# Patient Record
Sex: Female | Born: 1990 | Race: Black or African American | Hispanic: No | Marital: Single | Smoking: Former smoker
Health system: Southern US, Community
[De-identification: ages and names within clinical notes are randomized; demographics above are authoritative.]

## PROBLEM LIST (undated history)

## (undated) ENCOUNTER — Inpatient Hospital Stay (HOSPITAL_COMMUNITY): Payer: Self-pay

## (undated) DIAGNOSIS — K219 Gastro-esophageal reflux disease without esophagitis: Secondary | ICD-10-CM

## (undated) DIAGNOSIS — F419 Anxiety disorder, unspecified: Secondary | ICD-10-CM

## (undated) DIAGNOSIS — J45909 Unspecified asthma, uncomplicated: Secondary | ICD-10-CM

## (undated) DIAGNOSIS — A599 Trichomoniasis, unspecified: Secondary | ICD-10-CM

---
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
## 2012-02-09 ENCOUNTER — Inpatient Hospital Stay (HOSPITAL_COMMUNITY)
Admission: AD | Admit: 2012-02-09 | Discharge: 2012-02-09 | Disposition: A | Discharge status: Home / Self Care | Payer: Medicaid Other | Source: Ambulatory Visit | Attending: Obstetrics & Gynecology | Admitting: Obstetrics & Gynecology

## 2012-02-09 ENCOUNTER — Inpatient Hospital Stay (HOSPITAL_COMMUNITY): Payer: Medicaid Other

## 2012-02-09 ENCOUNTER — Encounter (HOSPITAL_COMMUNITY): Payer: Self-pay | Admitting: *Deleted

## 2012-02-09 DIAGNOSIS — O0930 Supervision of pregnancy with insufficient antenatal care, unspecified trimester: Secondary | ICD-10-CM

## 2012-02-09 DIAGNOSIS — O26859 Spotting complicating pregnancy, unspecified trimester: Secondary | ICD-10-CM | POA: Insufficient documentation

## 2012-02-09 DIAGNOSIS — R109 Unspecified abdominal pain: Secondary | ICD-10-CM | POA: Insufficient documentation

## 2012-02-09 DIAGNOSIS — N939 Abnormal uterine and vaginal bleeding, unspecified: Secondary | ICD-10-CM

## 2012-02-09 HISTORY — DX: Anxiety disorder, unspecified: F41.9

## 2012-02-09 HISTORY — DX: Unspecified asthma, uncomplicated: J45.909

## 2012-02-09 LAB — URINALYSIS, ROUTINE W REFLEX MICROSCOPIC
Bilirubin Urine: NEGATIVE
Ketones, ur: NEGATIVE mg/dL
Nitrite: NEGATIVE
Protein, ur: NEGATIVE mg/dL

## 2012-02-09 LAB — WET PREP, GENITAL

## 2012-02-09 LAB — URINE MICROSCOPIC-ADD ON

## 2012-02-09 MED ORDER — CONCEPT OB 130-92.4-1 MG PO CAPS: 1.0000 | ORAL_CAPSULE | ORAL | Status: DC

## 2012-02-09 NOTE — MAU Provider Note (Signed)
Chief Complaint:  Abdominal Pain and Vaginal Bleeding   First Provider Initiated Contact with Patient 02/09/12 1551      HPI: Marissa Lucas is a 21 y.o. G2P1001 at [redacted]w[redacted]d who presents to maternity admissions reporting intermittent lower abdominal crampy pain since yesterday and saw some brown discharge with wiping this morning. No frank blood and no further brown discharge. Denies irritation or itch. Denies dysuria, frequency or urgency of urination, hematuria, no hemorrhoids. Denies tobacco or illicit drug use. Denies contractions or leakage of fluid. Good fetal movement.   Pregnancy Course: Has had some prenatal care in [place] and done [place] and is planning to get regular prenatal care at [facility]. Last prenatal checkup about 6 weeks ago. No known pregnancy problems. Living at [facility] at [facility] now. States 17 week outside ultrasound was normal.  Past Medical History: Past Medical History  Diagnosis Date  . Asthma   . Anxiety     Past obstetric history: OB History    Grav Para Term Preterm Abortions TAB SAB Ect Mult Living   2 1 1       1      # Outc Date GA Lbr Len/2nd Wgt Sex Del Anes PTL Lv   1 TRM            2 CUR             uncomplicated SVD 2009  Past Surgical History: Past Surgical History  Procedure Date  . No past surgeries     Family History: Family History  Problem Relation Age of Onset  . Other Neg Hx     Social History: History  Substance Use Topics  . Smoking status: Never Smoker   . Smokeless tobacco: Never Used  . Alcohol Use: No    Allergies: No Known Allergies  Meds:  Prescriptions prior to admission  Medication Sig Dispense Refill  . ondansetron (ZOFRAN) 4 MG tablet Take 4 mg by mouth every 8 (eight) hours as needed. nausea        ROS: Pertinent findings in history of present illness.  Physical Exam  Blood pressure 113/66, pulse 96, temperature 98.3 F (36.8 C), temperature source Oral, resp. rate 18, height 5\' 7"  (1.702 m), weight 170  lb (77.111 kg). GENERAL: Well-developed, well-nourished female in no acute distress.  HEENT: normocephalic HEART: normal rate RESP: normal effort ABDOMEN: Soft, non-tender, gravid appropriate for gestational age EXTREMITIES: Nontender, no edema NEURO: alert and oriented SPECULUM EXAM: NEFG, physiologic discharge, no blood, cervix beefy appearance but no blood on endocx. swab  Dilation: Closed Effacement (%): Thick Exam by:: D. Ahaan Zobrist cx posterior, soft, closed/,long/breech out of pelvis  FHT:  Baseline 125-130 , moderate variability, accelerations present, no decelerations Contractions: irregular, mild   Labs: Results for orders placed during the hospital encounter of 02/09/12 (from the past 24 hour(s))  URINALYSIS, ROUTINE W REFLEX MICROSCOPIC     Status: Abnormal   Collection Time   02/09/12  1:00 PM      Component Value Range   Color, Urine YELLOW  YELLOW   APPearance CLOUDY (*) CLEAR   Specific Gravity, Urine 1.020  1.005 - 1.030   pH 7.5  5.0 - 8.0   Glucose, UA NEGATIVE  NEGATIVE mg/dL   Hgb urine dipstick NEGATIVE  NEGATIVE   Bilirubin Urine NEGATIVE  NEGATIVE   Ketones, ur NEGATIVE  NEGATIVE mg/dL   Protein, ur NEGATIVE  NEGATIVE mg/dL   Urobilinogen, UA 0.2  0.0 - 1.0 mg/dL   Nitrite  NEGATIVE  NEGATIVE   Leukocytes, UA SMALL (*) NEGATIVE  URINE MICROSCOPIC-ADD ON     Status: Abnormal   Collection Time   02/09/12  1:00 PM      Component Value Range   Squamous Epithelial / LPF MANY (*) RARE   WBC, UA 0-2  <3 WBC/hpf   Bacteria, UA MANY (*) RARE    Imaging:   D/W Dr. Marice Potter: Will get limited US this visit since [facility] here, no records *RADIOLOGY REPORT*  Clinical Data: History of spotting.  LIMITED OBSTETRIC ULTRASOUND  Number of Fetuses: 1  Heart Rate: 138 bpm  Presentation: Cephalic  Placental Location: Anterior above the cervical loss.  Previa: Not identified  Amniotic Fluid (Subjective): Within normal limits.  Vertical pocket: 4.55cm AFI: 53rd percentile   BPD: 7.5cm 30w 2d EDC: 04/22/2012  MATERNAL FINDINGS:  Cervix: Closed and 3 cm in length.  Uterus/Adnexae: Unremarkable.  IMPRESSION:  1. Single viable IUP estimated gestational age of [redacted] weeks and 2  days, normal fetal heart rate of 138 beats per minute and normal  amnionic fluid index.    GC/CT, urine C&S sent  Assessment: 1. Vaginal spotting   2. Insufficient prenatal care    G2P1001 at [redacted]w[redacted]d with hx vaginal spotting; lapsed [facility]  Plan: Discharge home Labor precautions and fetal kick counts  Follow-up Information    Schedule an appointment as soon as possible for a visit with [facility]. (If they are not able to see you for pregnancy care call [phone] [facility])    Contact information:   [address] [place] [address] [phone]          Medication List     As of 02/09/2012  6:23 PM    TAKE these medications         CONCEPT OB 130-92.4-1 MG Caps   Take 1 tablet by mouth 1 day or 1 dose.      ondansetron 4 MG tablet   Commonly known as: ZOFRAN   Take 4 mg by mouth every 8 (eight) hours as needed. nausea          Danae Orleans, CNM 02/09/2012 3:53 PM

## 2012-02-09 NOTE — MAU Note (Signed)
No care for past 6wk, [place] different county.  Past couple days has been having lower abd cramping.  Brownish spotting today.

## 2012-02-10 LAB — URINE CULTURE: Colony Count: 30000

## 2012-02-17 ENCOUNTER — Inpatient Hospital Stay (HOSPITAL_COMMUNITY)
Admission: AD | Admit: 2012-02-17 | Discharge: 2012-02-17 | Disposition: A | Discharge status: Home / Self Care | Payer: Medicaid Other | Source: Ambulatory Visit | Attending: Obstetrics & Gynecology | Admitting: Obstetrics & Gynecology

## 2012-02-17 ENCOUNTER — Encounter (HOSPITAL_COMMUNITY): Payer: Self-pay | Admitting: *Deleted

## 2012-02-17 DIAGNOSIS — F5089 Other specified eating disorder: Secondary | ICD-10-CM

## 2012-02-17 DIAGNOSIS — O9989 Other specified diseases and conditions complicating pregnancy, childbirth and the puerperium: Secondary | ICD-10-CM | POA: Insufficient documentation

## 2012-02-17 DIAGNOSIS — L299 Pruritus, unspecified: Secondary | ICD-10-CM | POA: Insufficient documentation

## 2012-02-17 DIAGNOSIS — O0930 Supervision of pregnancy with insufficient antenatal care, unspecified trimester: Secondary | ICD-10-CM | POA: Insufficient documentation

## 2012-02-17 DIAGNOSIS — Z3480 Encounter for supervision of other normal pregnancy, unspecified trimester: Secondary | ICD-10-CM

## 2012-02-17 MED ORDER — DIPHENHYDRAMINE HCL 25 MG PO TABS: 25.0000 mg | ORAL_TABLET | Freq: Four times a day (QID) | ORAL | Status: DC | PRN

## 2012-02-17 MED ORDER — DIPHENHYDRAMINE HCL 25 MG PO CAPS
25.0000 mg | ORAL_CAPSULE | Freq: Once | ORAL | Status: AC
  Administered 2012-02-17: 25 mg via ORAL
  Filled 2012-02-17: qty 1

## 2012-02-17 NOTE — MAU Note (Signed)
Itching X 1-2 weeks. States had a "few rashes" but they are gone now. Still itching, worse at night. Has not tried any medication or cream.

## 2012-02-17 NOTE — MAU Provider Note (Signed)
Chief Complaint:  Pruritis   First Provider Initiated Contact with Patient 02/17/12 1021      HPI: Marissa Lucas is a 21 y.o. G2P1001 at [redacted]w[redacted]d who presents to maternity admissions reporting 1-2 wk hx of generalized itching over shoulders, chest, arms and thighs. Has not tried any tx. Had a rash last wk described as small bumps on chest and arms but it has resolved. Denies new contacts with potential allergens. ANother resident at [facility] has had a rash of unknown etiology.  Denies contractions, leakage of fluid or vaginal bleeding. Good fetal movement.   Pregnancy Course: NPC but lives at [facility] and has NOB for 03/04/12. Has signed ROI at last MAU visit  for outside records for [place] and some PNC: no records in system.  Past Medical History: Past Medical History  Diagnosis Date  . Asthma   . Anxiety     Past obstetric history: OB History    Grav Para Term Preterm Abortions TAB SAB Ect Mult Living   2 1 1       1      # Outc Date GA Lbr Len/2nd Wgt Sex Del Anes PTL Lv   1 TRM            2 CUR               Past Surgical History: Past Surgical History  Procedure Date  . No past surgeries     Family History: Family History  Problem Relation Age of Onset  . Other Neg Hx     Social History: History  Substance Use Topics  . Smoking status: Never Smoker   . Smokeless tobacco: Never Used  . Alcohol Use: No    Allergies: No Known Allergies  Meds:  Prescriptions prior to admission  Medication Sig Dispense Refill  . ALBUTEROL IN Inhale 2 puffs into the lungs daily as needed. For shortness of breath.      . ondansetron (ZOFRAN) 4 MG tablet Take 4 mg by mouth every 8 (eight) hours as needed. nausea        ROS: Pertinent findings in history of present illness.  Physical Exam  Blood pressure 113/56, pulse 100, temperature 98.5 F (36.9 C), temperature source Oral, resp. rate 18, height 5\' 7"  (1.702 m), weight 172 lb 3.2 oz (78.109 kg). GENERAL:  Well-developed, well-nourished female in no acute distress.  HEENT: normocephalic HEART: normal rate RESP: normal effort ABDOMEN: Soft, non-tender, gravid appropriate for gestational age EXTREMITIES: Nontender, no edema NEURO: alert and oriented SKIN: no rash noted, scratching observed  FHT:  Baseline 120-125 , moderate variability, accelerations present, no decelerations Contractions: none   Labs: No results found for this or any previous visit (from the past 24 hour(s)).  Imaging:  US Ob Limited  02/09/2012  *RADIOLOGY REPORT*  Clinical Data: History of spotting.  LIMITED OBSTETRIC ULTRASOUND  Number of Fetuses: 1 Heart Rate: 138 bpm Presentation: Cephalic Placental Location: Anterior above the cervical loss. Previa: Not identified Amniotic Fluid (Subjective): Within normal limits.  Vertical pocket:  4.55cm      AFI: 53rd percentile  BPD: 7.5cm   30w   2d   EDC: 04/22/2012  MATERNAL FINDINGS: Cervix: Closed and 3 cm in length. Uterus/Adnexae: Unremarkable.  IMPRESSION: 1.  Single viable IUP estimated gestational age of [redacted] weeks and 2 days, normal fetal heart rate of 138 beats per minute and normal amnionic fluid index.   Original Report Authenticated By: Trudie Reed, M.D.  MAU Course: Benadryl 25 mg po given with some relief  Assessment: 1. Generalized pruritus   2. Insufficient prenatal care     Plan: Discharge home Return fasting for bile acids and outpt [place] this week See AVS Follow-up Information    Follow up with [facility]. On 03/04/2012.          Medication List     As of 02/17/2012 10:32 AM    ASK your doctor about these medications         ALBUTEROL IN   Inhale 2 puffs into the lungs daily as needed. For shortness of breath.      ondansetron 4 MG tablet   Commonly known as: ZOFRAN   Take 4 mg by mouth every 8 (eight) hours as needed. nausea        Danae Orleans, CNM 02/17/2012 10:32 AM

## 2012-02-18 NOTE — L&D Delivery Note (Signed)
Delivery Note Pt pushed approx one hour and at 9:26 PM a viable female was delivered via Vaginal, Spontaneous Delivery (Presentation: ; Right Occiput Posterior).  APGAR: 8, 6 ; weight 7+6.  Infant to pt's abd, dried. Cord clamped and cut by CNM. Hospital cord sample collected. Placenta status: Intact, Spontaneous.  Cord: 3 vessels   Anesthesia: Epidural  Episiotomy: None Lacerations: None Est. Blood Loss (mL): 350  Mom to postpartum.  Baby to nursery-stable.  Marissa Lucas 04/16/2012, 10:01 PM

## 2012-02-18 NOTE — L&D Delivery Note (Signed)
Attestation of Attending Supervision of Advanced Practitioner (CNM/NP): Evaluation and management procedures were performed by the Advanced Practitioner under my supervision and collaboration.  I have reviewed the Advanced Practitioner's note and chart, and I agree with the management and plan.  Jaylei Fuerte 04/21/2012 9:23 AM   

## 2012-02-19 ENCOUNTER — Ambulatory Visit (HOSPITAL_COMMUNITY)
Admission: RE | Admit: 2012-02-19 | Discharge: 2012-02-19 | Disposition: A | Discharge status: Home / Self Care | Payer: Medicaid Other | Source: Ambulatory Visit | Attending: Obstetrics and Gynecology | Admitting: Obstetrics and Gynecology

## 2012-02-19 DIAGNOSIS — O0930 Supervision of pregnancy with insufficient antenatal care, unspecified trimester: Secondary | ICD-10-CM | POA: Insufficient documentation

## 2012-02-19 DIAGNOSIS — Z3480 Encounter for supervision of other normal pregnancy, unspecified trimester: Secondary | ICD-10-CM

## 2012-02-19 DIAGNOSIS — Z3689 Encounter for other specified antenatal screening: Secondary | ICD-10-CM | POA: Insufficient documentation

## 2012-02-23 ENCOUNTER — Encounter (HOSPITAL_COMMUNITY): Payer: Self-pay | Admitting: *Deleted

## 2012-02-23 ENCOUNTER — Inpatient Hospital Stay (HOSPITAL_COMMUNITY)
Admission: AD | Admit: 2012-02-23 | Discharge: 2012-02-24 | Disposition: A | Discharge status: Hospice | Payer: Medicaid Other | Source: Ambulatory Visit | Attending: Obstetrics & Gynecology | Admitting: Obstetrics & Gynecology

## 2012-02-23 DIAGNOSIS — B373 Candidiasis of vulva and vagina: Secondary | ICD-10-CM

## 2012-02-23 DIAGNOSIS — O26859 Spotting complicating pregnancy, unspecified trimester: Secondary | ICD-10-CM | POA: Insufficient documentation

## 2012-02-23 DIAGNOSIS — R109 Unspecified abdominal pain: Secondary | ICD-10-CM | POA: Insufficient documentation

## 2012-02-23 DIAGNOSIS — N760 Acute vaginitis: Secondary | ICD-10-CM

## 2012-02-23 DIAGNOSIS — O4700 False labor before 37 completed weeks of gestation, unspecified trimester: Secondary | ICD-10-CM | POA: Insufficient documentation

## 2012-02-23 HISTORY — DX: Gastro-esophageal reflux disease without esophagitis: K21.9

## 2012-02-23 LAB — URINE MICROSCOPIC-ADD ON

## 2012-02-23 LAB — URINALYSIS, ROUTINE W REFLEX MICROSCOPIC
Glucose, UA: NEGATIVE mg/dL
Ketones, ur: 15 mg/dL — AB
Protein, ur: NEGATIVE mg/dL
pH: 6 (ref 5.0–8.0)

## 2012-02-23 LAB — WET PREP, GENITAL: Yeast Wet Prep HPF POC: NONE SEEN

## 2012-02-23 MED ORDER — NIFEDIPINE 10 MG PO CAPS
20.0000 mg | ORAL_CAPSULE | Freq: Once | ORAL | Status: AC
  Administered 2012-02-23: 20 mg via ORAL
  Filled 2012-02-23: qty 2

## 2012-02-23 MED ORDER — FLUCONAZOLE 150 MG PO TABS
150.0000 mg | ORAL_TABLET | Freq: Once | ORAL | Status: AC
  Administered 2012-02-23: 150 mg via ORAL
  Filled 2012-02-23: qty 1

## 2012-02-23 MED ORDER — METRONIDAZOLE 500 MG PO TABS
500.0000 mg | ORAL_TABLET | Freq: Once | ORAL | Status: AC
  Administered 2012-02-23: 500 mg via ORAL
  Filled 2012-02-23: qty 1

## 2012-02-23 NOTE — MAU Provider Note (Signed)
History     CSN: [id number]  Arrival date and time: 02/23/12 2023   None     No chief complaint on file.  HPI 22 y.o. G2P1001 at [redacted]w[redacted]d with spotting for 1 week. Sometimes small clots -reddish. Other times brownish. Back pain and cramps in lower abdomen since this morning, constant. 8/10. Taking tylenol, helps for a while. White clumps tonight. Some dysuria. No fever/chills, no nausea/vomiting. Baby moving less today but doing well up till now. No loss of fluid. Last intercourse 5 months ago. No GC/Chlamydia this pregnancy.  No prenatal care since 23 weeks, seen in [place]. Moved here but has not been able to establish care. Had sono here Friday, has appt 1/16 here at [facility]. No complications with this pregnancy and none with previous. Vaginal delivery, no complications.  OB History    Grav Para Term Preterm Abortions TAB SAB Ect Mult Living   2 1 1       1       Past Medical History  Diagnosis Date  . Asthma   . Anxiety   . GERD (gastroesophageal reflux disease)     Past Surgical History  Procedure Date  . No past surgeries     Family History  Problem Relation Age of Onset  . Other Neg Hx   . Diabetes Mother   . Heart disease Mother   . Hypertension Mother   . Kidney disease Mother   . Arthritis Maternal Grandmother   . Asthma Maternal Grandmother   . Diabetes Maternal Grandmother   . Heart disease Maternal Grandmother   . Hypertension Maternal Grandmother   . Stroke Maternal Grandmother     History  Substance Use Topics  . Smoking status: Smoked prior to pregnancy   . Smokeless tobacco: Never Used  . Alcohol Use: No    Allergies: No Known Allergies  Prescriptions prior to admission  Medication Sig Dispense Refill  . acetaminophen (TYLENOL) 325 MG tablet Take 325 mg by mouth every 6 (six) hours as needed.      . ALBUTEROL IN Inhale 2 puffs into the lungs daily as needed. For shortness of breath.      . diphenhydrAMINE (BENADRYL) 25 MG tablet Take 1 tablet  (25 mg total) by mouth every 6 (six) hours as needed for itching (May take 50 mg at night if needed for severe itching).  30 tablet  0  . ondansetron (ZOFRAN) 4 MG tablet Take 4 mg by mouth every 8 (eight) hours as needed. nausea        Review of Systems  Constitutional: Negative for fever and chills.  Eyes: Negative for blurred vision and double vision.  Respiratory: Negative for shortness of breath.   Cardiovascular: Negative for chest pain.  Gastrointestinal: Positive for abdominal pain. Negative for nausea, vomiting, diarrhea and constipation.  Genitourinary: Positive for dysuria and urgency.  Musculoskeletal: Positive for back pain.  Neurological: Negative for headaches.   Physical Exam   Blood pressure 111/75, pulse 89, temperature 98.9 F (37.2 C), temperature source Oral, resp. rate 16, height 5\' 7"  (1.702 m), weight 78.472 kg (173 lb), SpO2 100.00%.  Physical Exam  Constitutional: She is oriented to person, place, and time. She appears well-developed and well-nourished. No distress.  HENT:  Head: Normocephalic and atraumatic.  Eyes: Conjunctivae normal and EOM are normal.  Neck: Normal range of motion. Neck supple.  Cardiovascular: Normal rate, regular rhythm and normal heart sounds.   Respiratory: Effort normal and breath sounds normal. No respiratory distress.  GI: Soft. There is no tenderness. There is no rebound and no guarding.  Genitourinary:       External genitalia normal. Vagina - watery and thick/chunky discharge. Small amount of blood in vaginal vault. Cervix/vagina looks irritated. Cervix visually closed. Closed/thick/high with digital exam. Pt very uncomfortable with check and spec exam but no CMT.  Musculoskeletal: Normal range of motion. She exhibits no edema and no tenderness.  Neurological: She is alert and oriented to person, place, and time.  Skin: Skin is warm and dry.   Cervix:  Closed/thick/high  FHTs:  130, mod var, accels present, no decels TOCO:   Ctx q 4 minutes  MAU Course  Procedures  20 mg Procardia given, contractions resolved per patient. However, contractions started again at 01:00 and pt states they are stronger.  On monitor, ctx are 1 in 10 minutes or less. Cervical exam:  At 01:30, closed internally, long, high, very posterior. 10 mg nubain given IM.   Assessment and Plan  22 y.o. G2P1001 at [redacted]w[redacted]d with  - Vaginal spotting - likely due to vaginitis:  Diflucan, metronidazole - Cramping - contractions on monitor, no cervical change.  - Home with labor precautions, f/u in [place] 1/16.  Napoleon Form 02/23/2012, 10:19 PM

## 2012-02-24 ENCOUNTER — Other Ambulatory Visit (HOSPITAL_COMMUNITY): Payer: Medicaid Other

## 2012-02-24 DIAGNOSIS — B373 Candidiasis of vulva and vagina: Secondary | ICD-10-CM

## 2012-02-24 DIAGNOSIS — O4700 False labor before 37 completed weeks of gestation, unspecified trimester: Secondary | ICD-10-CM

## 2012-02-24 MED ORDER — NALBUPHINE SYRINGE 5 MG/0.5 ML
10.0000 mg | INJECTION | Freq: Once | INTRAMUSCULAR | Status: AC
  Administered 2012-02-24: 10 mg via INTRAMUSCULAR
  Filled 2012-02-24: qty 1

## 2012-02-24 MED ORDER — NIFEDIPINE 10 MG PO CAPS: 10.0000 mg | ORAL_CAPSULE | Freq: Once | ORAL | Status: DC

## 2012-02-24 MED ORDER — ONDANSETRON 4 MG PO TBDP
4.0000 mg | ORAL_TABLET | Freq: Four times a day (QID) | ORAL | Status: DC | PRN
  Administered 2012-02-24: 4 mg via ORAL
  Filled 2012-02-24: qty 1

## 2012-02-24 MED ORDER — FLUCONAZOLE 150 MG PO TABS: 150.0000 mg | ORAL_TABLET | Freq: Once | ORAL | Status: DC

## 2012-02-24 MED ORDER — METRONIDAZOLE 500 MG PO TABS: 500.0000 mg | ORAL_TABLET | Freq: Two times a day (BID) | ORAL | Status: AC

## 2012-02-25 ENCOUNTER — Encounter (HOSPITAL_COMMUNITY): Payer: Self-pay | Admitting: *Deleted

## 2012-02-25 ENCOUNTER — Inpatient Hospital Stay (HOSPITAL_COMMUNITY)
Admission: AD | Admit: 2012-02-25 | Discharge: 2012-02-25 | Disposition: A | Discharge status: Home / Self Care | Payer: Medicaid Other | Source: Ambulatory Visit | Attending: Obstetrics and Gynecology | Admitting: Obstetrics and Gynecology

## 2012-02-25 ENCOUNTER — Encounter: Payer: Self-pay | Admitting: *Deleted

## 2012-02-25 DIAGNOSIS — N939 Abnormal uterine and vaginal bleeding, unspecified: Secondary | ICD-10-CM

## 2012-02-25 DIAGNOSIS — O26859 Spotting complicating pregnancy, unspecified trimester: Secondary | ICD-10-CM | POA: Insufficient documentation

## 2012-02-25 DIAGNOSIS — Z3480 Encounter for supervision of other normal pregnancy, unspecified trimester: Secondary | ICD-10-CM

## 2012-02-25 LAB — URINALYSIS, ROUTINE W REFLEX MICROSCOPIC
Glucose, UA: NEGATIVE mg/dL
Specific Gravity, Urine: 1.025 (ref 1.005–1.030)
pH: 6 (ref 5.0–8.0)

## 2012-02-25 LAB — URINE MICROSCOPIC-ADD ON

## 2012-02-25 MED ORDER — NIFEDIPINE ER OSMOTIC RELEASE 30 MG PO TB24: 30.0000 mg | ORAL_TABLET | Freq: Every day | ORAL | Status: DC | PRN

## 2012-02-25 MED ORDER — NIFEDIPINE 10 MG PO CAPS
20.0000 mg | ORAL_CAPSULE | Freq: Once | ORAL | Status: AC
  Administered 2012-02-25: 20 mg via ORAL
  Filled 2012-02-25: qty 2

## 2012-02-25 NOTE — MAU Note (Signed)
Pt states saw blood in toilet after voiding this am, took picture to show provider, was bright red, unsure if still bleeding at present. Is cramping and having ctx's since 0300 this am. Denies abnormal vag d/c, has been seen for spotting with this pregnancy prior to today.

## 2012-02-25 NOTE — MAU Provider Note (Signed)
Chief Complaint:  Vaginal Bleeding   First Provider Initiated Contact with Patient 02/25/12 1047      HPI: .22 y.o. G2P1001 at [redacted]w[redacted]d with spotting for 1 week recently seen in MAU on 02/23/12. States she has had spotting for about one week and had one episode this AM of rush of blood (small amount tinging the toilet bowl) but otherwise nothing. Has had some abdominal pain but no changes in vision, no RUQ pain, no dysuria, hematuria, diarrhea, worsening vaginal discharge, no fever/chills, no nausea/vomiting. Baby moving > 10 x per 2 hrs. No loss of fluid. Last intercourse 5 months ago. Negative GC/Chalymidia in beginning of January 2014, and was dx with BV (currently on day 2/7 of Flagyl) on 02/23/12. Normal u/s on 02/20/12.   No prenatal care since 23 weeks, seen in [place]. Moved here but has not been able to establish care. Has appt 1/16 here at [facility]. No complications with this pregnancy and none with previous. Vaginal delivery, no complications.  Pregnancy Course:   Past Medical History: Past Medical History  Diagnosis Date  . Asthma   . Anxiety   . GERD (gastroesophageal reflux disease)     Past obstetric history: OB History    Grav Para Term Preterm Abortions TAB SAB Ect Mult Living   2 1 1       1      # Outc Date GA Lbr Len/2nd Wgt Sex Del Anes PTL Lv   1 TRM            2 CUR               Past Surgical History: Past Surgical History  Procedure Date  . No past surgeries     Family History: Family History  Problem Relation Age of Onset  . Other Neg Hx   . Diabetes Mother   . Heart disease Mother   . Hypertension Mother   . Kidney disease Mother   . Arthritis Maternal Grandmother   . Asthma Maternal Grandmother   . Diabetes Maternal Grandmother   . Heart disease Maternal Grandmother   . Hypertension Maternal Grandmother   . Stroke Maternal Grandmother     Social History: History  Substance Use Topics  . Smoking status: Never Smoker   . Smokeless tobacco: Never  Used  . Alcohol Use: No    Allergies: No Known Allergies  Meds:  Prescriptions prior to admission  Medication Sig Dispense Refill  . acetaminophen (TYLENOL) 325 MG tablet Take 325 mg by mouth every 6 (six) hours as needed.      . ALBUTEROL IN Inhale 2 puffs into the lungs daily as needed. For shortness of breath.      . diphenhydrAMINE (BENADRYL) 25 MG tablet Take 1 tablet (25 mg total) by mouth every 6 (six) hours as needed for itching (May take 50 mg at night if needed for severe itching).  30 tablet  0  . fluconazole (DIFLUCAN) 150 MG tablet Take 1 tablet (150 mg total) by mouth once. Take tablet in one week if vaginal discharge/discomfort not improved  1 tablet  0  . metroNIDAZOLE (FLAGYL) 500 MG tablet Take 1 tablet (500 mg total) by mouth 2 (two) times daily.  13 tablet  0  . ondansetron (ZOFRAN) 4 MG tablet Take 4 mg by mouth every 8 (eight) hours as needed. nausea        ROS: Pertinent findings in history of present illness.  Physical Exam  Blood pressure 112/71, pulse 91,  temperature 98.1 F (36.7 C), temperature source Oral, resp. rate 18, height 5\' 7"  (1.702 m), weight 77.792 kg (171 lb 8 oz). GENERAL: Well-developed, well-nourished female in no acute distress.  HEENT: normocephalic HEART: normal rate RESP: normal effort ABDOMEN: Soft, non-tender, gravid appropriate for gestational age EXTREMITIES: Nontender, no edema NEURO: alert and oriented SPECULUM EXAM: NEFG, physiologic discharge, no blood, cervix clean Dilation: 1 Effacement (%): Thick Station:  (high) Exam by:: Telford Nab CNM  FHT:  Baseline 130 , moderate variability, accelerations present, no decelerations Contractions: Irregular, infrequent   Labs: Results for orders placed during the hospital encounter of 02/25/12 (from the past 24 hour(s))  URINALYSIS, ROUTINE W REFLEX MICROSCOPIC     Status: Abnormal   Collection Time   02/25/12  9:13 AM      Component Value Range   Color, Urine YELLOW  YELLOW    APPearance CLEAR  CLEAR   Specific Gravity, Urine 1.025  1.005 - 1.030   pH 6.0  5.0 - 8.0   Glucose, UA NEGATIVE  NEGATIVE mg/dL   Hgb urine dipstick MODERATE (*) NEGATIVE   Bilirubin Urine NEGATIVE  NEGATIVE   Ketones, ur 15 (*) NEGATIVE mg/dL   Protein, ur NEGATIVE  NEGATIVE mg/dL   Urobilinogen, UA 0.2  0.0 - 1.0 mg/dL   Nitrite NEGATIVE  NEGATIVE   Leukocytes, UA SMALL (*) NEGATIVE  URINE MICROSCOPIC-ADD ON     Status: Abnormal   Collection Time   02/25/12  9:13 AM      Component Value Range   Squamous Epithelial / LPF MANY (*) RARE   WBC, UA 3-6  <3 WBC/hpf   RBC / HPF 0-2  <3 RBC/hpf   Bacteria, UA FEW (*) RARE   Urine-Other MUCOUS PRESENT      Imaging:  US Ob Comp + 14 Wk  02/19/2012  OBSTETRICAL ULTRASOUND: This exam was performed within a [facility]. The OB US report was generated in the [facility] system, and faxed to the ordering physician.   This report is also available in [employer] and in the [employer]. See [facility] Obstetric US report.   US Ob Limited  02/09/2012  *RADIOLOGY REPORT*  Clinical Data: History of spotting.  LIMITED OBSTETRIC ULTRASOUND  Number of Fetuses: 1 Heart Rate: 138 bpm Presentation: Cephalic Placental Location: Anterior above the cervical loss. Previa: Not identified Amniotic Fluid (Subjective): Within normal limits.  Vertical pocket:  4.55cm      AFI: 53rd percentile  BPD: 7.5cm   30w   2d   EDC: 04/22/2012  MATERNAL FINDINGS: Cervix: Closed and 3 cm in length. Uterus/Adnexae: Unremarkable.  IMPRESSION: 1.  Single viable IUP estimated gestational age of [redacted] weeks and 2 days, normal fetal heart rate of 138 beats per minute and normal amnionic fluid index.   Original Report Authenticated By: Trudie Reed, M.D.    MAU Course: 20 mg Procardia given for contractions.  FHT are reassuring Category 1.  Cervical exam reassuring with thick, long, 1 cm dilated and seems to be unchanged from previous exam two days ago.     Assessment: 22 y/o G2P1001 @ 31.6 with continued vaginal spotting.   1. Supervision of normal subsequent pregnancy   2. Vaginal spotting   Spotting likely d/t vaginitis and recent cervical exams - no evidence of active bleeding or cervical change  Plan: Discharge home Labor precautions, pelvic rest Continue Flagyl for BV Advised to keep appointment with Dr. Shawnie Pons.  Will send home on 8 day  supply of Procardia 30 mg XL and to take one per day for similar contractions.   Follow-up Information    Follow up with Reva Bores, MD. (Keep appointment on 03/04/12)    Contact information:   [address] [place] [address] [phone]           Medication List     As of 02/25/2012 11:00 AM    TAKE these medications         acetaminophen 325 MG tablet   Commonly known as: TYLENOL   Take 325 mg by mouth every 6 (six) hours as needed.      ALBUTEROL IN   Inhale 2 puffs into the lungs daily as needed. For shortness of breath.      diphenhydrAMINE 25 MG tablet   Commonly known as: BENADRYL   Take 1 tablet (25 mg total) by mouth every 6 (six) hours as needed for itching (May take 50 mg at night if needed for severe itching).      fluconazole 150 MG tablet   Commonly known as: DIFLUCAN   Take 1 tablet (150 mg total) by mouth once. Take tablet in one week if vaginal discharge/discomfort not improved      metroNIDAZOLE 500 MG tablet   Commonly known as: FLAGYL   Take 1 tablet (500 mg total) by mouth 2 (two) times daily.      NIFEdipine 30 MG 24 hr tablet   Commonly known as: PROCARDIA-XL/ADALAT-CC/NIFEDICAL-XL   Take 1 tablet (30 mg total) by mouth daily as needed. For sensation of contractions      ondansetron 4 MG tablet   Commonly known as: ZOFRAN   Take 4 mg by mouth every 8 (eight) hours as needed. nausea         Briscoe Deutscher, DO 02/25/2012 11:00 AM  I saw and examined patient along with resident and agree with above note.  Georges Mouse,  CNM 02/25/12 11:49 AM

## 2012-02-25 NOTE — MAU Provider Note (Signed)
Attestation of Attending Supervision of Advanced Practitioner (CNM/NP): Evaluation and management procedures were performed by the Advanced Practitioner under my supervision and collaboration.  I have reviewed the Advanced Practitioner's note and chart, and I agree with the management and plan.  Jeffrie Stander 02/25/2012 1:00 PM   

## 2012-02-26 LAB — URINE CULTURE: Colony Count: NO GROWTH

## 2012-03-04 ENCOUNTER — Ambulatory Visit (INDEPENDENT_AMBULATORY_CARE_PROVIDER_SITE_OTHER): Payer: Medicaid Other | Admitting: Family Medicine

## 2012-03-04 ENCOUNTER — Encounter: Payer: Medicaid Other | Admitting: Family Medicine

## 2012-03-04 ENCOUNTER — Encounter: Payer: Self-pay | Admitting: Family Medicine

## 2012-03-04 VITALS — BP 111/71 | Temp 97.6°F | Wt 172.0 lb

## 2012-03-04 DIAGNOSIS — K219 Gastro-esophageal reflux disease without esophagitis: Secondary | ICD-10-CM | POA: Insufficient documentation

## 2012-03-04 DIAGNOSIS — Z3480 Encounter for supervision of other normal pregnancy, unspecified trimester: Secondary | ICD-10-CM

## 2012-03-04 DIAGNOSIS — O99519 Diseases of the respiratory system complicating pregnancy, unspecified trimester: Secondary | ICD-10-CM

## 2012-03-04 DIAGNOSIS — Z23 Encounter for immunization: Secondary | ICD-10-CM

## 2012-03-04 DIAGNOSIS — J45909 Unspecified asthma, uncomplicated: Secondary | ICD-10-CM | POA: Insufficient documentation

## 2012-03-04 LAB — POCT URINALYSIS DIP (DEVICE)
Nitrite: NEGATIVE
Protein, ur: 30 mg/dL — AB
pH: 7 (ref 5.0–8.0)

## 2012-03-04 LAB — CBC
MCH: 29.5 pg (ref 26.0–34.0)
MCHC: 34.5 g/dL (ref 30.0–36.0)
Platelets: 167 10*3/uL (ref 150–400)
RDW: 12.7 % (ref 11.5–15.5)

## 2012-03-04 MED ORDER — TETANUS-DIPHTH-ACELL PERTUSSIS 5-2.5-18.5 LF-MCG/0.5 IM SUSP
0.5000 mL | Freq: Once | INTRAMUSCULAR | Status: AC
  Administered 2012-03-04: 0.5 mL via INTRAMUSCULAR

## 2012-03-04 NOTE — Progress Notes (Signed)
Nutrition note: 1st visit consult Pt has gained 14# @ [redacted]w[redacted]d, which is < expected. Pt reports eating 3-4x/d. Pt reports she is not taking PNV due to them making her sick. Pt reports N&V occ, heartburn, and constipation.  Pt received verbal & written education on general nutrition during pregnancy. Disc tips to decrease N&V, heartburn and constipation. Disc option of taking 2 chewable multivitamins vs PNV. Disc tips to increase wt gain. Disc wt gain goals of 25-35# or 1#/wk.  Pt agrees to take PNV or equivalent and include a protein source with all meals & snacks. Pt receives [facility] & plans to BF. F/u in 2-4 weeks Blondell Reveal, MS, RD, LDN

## 2012-03-04 NOTE — Progress Notes (Signed)
New Ob--started care in [place], [place]--nml anatomy and quad there.  Dating is good. 28 wk labs today-TdaP, declines flu SW

## 2012-03-04 NOTE — Patient Instructions (Signed)
Pregnancy - Third Trimester The third trimester of pregnancy (the last 3 months) is a period of the most rapid growth for you and your baby. The baby approaches a length of 20 inches and a weight of 6 to 10 pounds. The baby is adding on fat and getting ready for life outside your body. While inside, babies have periods of sleeping and waking, suck their thumbs, and hiccups. You can often feel small contractions of the uterus. This is false labor. It is also called Braxton-Hicks contractions. This is like a practice for labor. The usual problems in this stage of pregnancy include more difficulty breathing, swelling of the hands and feet from water retention, and having to urinate more often because of the uterus and baby pressing on your bladder.  PRENATAL EXAMS  Blood work may continue to be done during prenatal exams. These tests are done to check on your health and the probable health of your baby. Blood work is used to follow your blood levels (hemoglobin). Anemia (low hemoglobin) is common during pregnancy. Iron and vitamins are given to help prevent this. You may also continue to be checked for diabetes. Some of the past blood tests may be done again.  The size of the uterus is measured during each visit. This makes sure your baby is growing properly according to your pregnancy dates.  Your blood pressure is checked every prenatal visit. This is to make sure you are not getting toxemia.  Your urine is checked every prenatal visit for infection, diabetes and protein.  Your weight is checked at each visit. This is done to make sure gains are happening at the suggested rate and that you and your baby are growing normally.  Sometimes, an ultrasound is performed to confirm the position and the proper growth and development of the baby. This is a test done that bounces harmless sound waves off the baby so your caregiver can more accurately determine due dates.  Discuss the type of pain medication  and anesthesia you will have during your labor and delivery.  Discuss the possibility and anesthesia if a Cesarean Section might be necessary.  Inform your caregiver if there is any mental or physical violence at home. Sometimes, a specialized non-stress test, contraction stress test and biophysical profile are done to make sure the baby is not having a problem. Checking the amniotic fluid surrounding the baby is called an amniocentesis. The amniotic fluid is removed by sticking a needle into the belly (abdomen). This is sometimes done near the end of pregnancy if an early delivery is required. In this case, it is done to help make sure the baby's lungs are mature enough for the baby to live outside of the womb. If the lungs are not mature and it is unsafe to deliver the baby, an injection of cortisone medication is given to the mother 1 to 2 days before the delivery. This helps the baby's lungs mature and makes it safer to deliver the baby. CHANGES OCCURING IN THE THIRD TRIMESTER OF PREGNANCY Your body goes through many changes during pregnancy. They vary from person to person. Talk to your caregiver about changes you notice and are concerned about.  During the last trimester, you have probably had an increase in your appetite. It is normal to have cravings for certain foods. This varies from person to person and pregnancy to pregnancy.  You may begin to get stretch marks on your hips, abdomen, and breasts. These are normal changes in the body   during pregnancy. There are no exercises or medications to take which prevent this change.  Constipation may be treated with a stool softener or adding bulk to your diet. Drinking lots of fluids, fiber in vegetables, fruits, and whole grains are helpful.  Exercising is also helpful. If you have been very active up until your pregnancy, most of these activities can be continued during your pregnancy. If you have been less active, it is helpful to start an  exercise program such as walking. Consult your caregiver before starting exercise programs.  Avoid all smoking, alcohol, un-prescribed drugs, herbs and "street drugs" during your pregnancy. These chemicals affect the formation and growth of the baby. Avoid chemicals throughout the pregnancy to ensure the delivery of a healthy infant.  Backache, varicose veins and hemorrhoids may develop or get worse.  You will tire more easily in the third trimester, which is normal.  The baby's movements may be stronger and more often.  You may become short of breath easily.  Your belly button may stick out.  A yellow discharge may leak from your breasts called colostrum.  You may have a bloody mucus discharge. This usually occurs a few days to a week before labor begins. HOME CARE INSTRUCTIONS   Keep your caregiver's appointments. Follow your caregiver's instructions regarding medication use, exercise, and diet.  During pregnancy, you are providing food for you and your baby. Continue to eat regular, well-balanced meals. Choose foods such as meat, fish, milk and other low fat dairy products, vegetables, fruits, and whole-grain breads and cereals. Your caregiver will tell you of the ideal weight gain.  A physical sexual relationship may be continued throughout pregnancy if there are no other problems such as early (premature) leaking of amniotic fluid from the membranes, vaginal bleeding, or belly (abdominal) pain.  Exercise regularly if there are no restrictions. Check with your caregiver if you are unsure of the safety of your exercises. Greater weight gain will occur in the last 2 trimesters of pregnancy. Exercising helps:  Control your weight.  Get you in shape for labor and delivery.  You lose weight after you deliver.  Rest a lot with legs elevated, or as needed for leg cramps or low back pain.  Wear a good support or jogging bra for breast tenderness during pregnancy. This may help if worn  during sleep. Pads or tissues may be used in the bra if you are leaking colostrum.  Do not use hot tubs, steam rooms, or saunas.  Wear your seat belt when driving. This protects you and your baby if you are in an accident.  Avoid raw meat, cat litter boxes and soil used by cats. These carry germs that can cause birth defects in the baby.  It is easier to loose urine during pregnancy. Tightening up and strengthening the pelvic muscles will help with this problem. You can practice stopping your urination while you are going to the bathroom. These are the same muscles you need to strengthen. It is also the muscles you would use if you were trying to stop from passing gas. You can practice tightening these muscles up 10 times a set and repeating this about 3 times per day. Once you know what muscles to tighten up, do not perform these exercises during urination. It is more likely to cause an infection by backing up the urine.  Ask for help if you have financial, counseling or nutritional needs during pregnancy. Your caregiver will be able to offer counseling for these   needs as well as refer you for other special needs.  Make a list of emergency phone numbers and have them available.  Plan on getting help from family or friends when you go home from the hospital.  Make a trial run to the hospital.  Take prenatal classes with the father to understand, practice and ask questions about the labor and delivery.  Prepare the baby's room/nursery.  Do not travel out of the city unless it is absolutely necessary and with the advice of your caregiver.  Wear only low or no heal shoes to have better balance and prevent falling. MEDICATIONS AND DRUG USE IN PREGNANCY  Take prenatal vitamins as directed. The vitamin should contain 1 milligram of folic acid. Keep all vitamins out of reach of children. Only a couple vitamins or tablets containing iron may be fatal to a baby or young child when  ingested.  Avoid use of all medications, including herbs, over-the-counter medications, not prescribed or suggested by your caregiver. Only take over-the-counter or prescription medicines for pain, discomfort, or fever as directed by your caregiver. Do not use aspirin, ibuprofen (Motrin, Advil, Nuprin) or naproxen (Aleve) unless OK'd by your caregiver.  Let your caregiver also know about herbs you may be using.  Alcohol is related to a number of birth defects. This includes fetal alcohol syndrome. All alcohol, in any form, should be avoided completely. Smoking will cause low birth rate and premature babies.  Street/illegal drugs are very harmful to the baby. They are absolutely forbidden. A baby born to an addicted mother will be addicted at birth. The baby will go through the same withdrawal an adult does. SEEK MEDICAL CARE IF: You have any concerns or worries during your pregnancy. It is better to call with your questions if you feel they cannot wait, rather than worry about them. DECISIONS ABOUT CIRCUMCISION You may or may not know the sex of your baby. If you know your baby is a boy, it may be time to think about circumcision. Circumcision is the removal of the foreskin of the penis. This is the skin that covers the sensitive end of the penis. There is no proven medical need for this. Often this decision is made on what is popular at the time or based upon religious beliefs and social issues. You can discuss these issues with your caregiver or pediatrician. SEEK IMMEDIATE MEDICAL CARE IF:   An unexplained oral temperature above 102 F (38.9 C) develops, or as your caregiver suggests.  You have leaking of fluid from the vagina (birth canal). If leaking membranes are suspected, take your temperature and tell your caregiver of this when you call.  There is vaginal spotting, bleeding or passing clots. Tell your caregiver of the amount and how many pads are used.  You develop a bad smelling  vaginal discharge with a change in the color from clear to white.  You develop vomiting that lasts more than 24 hours.  You develop chills or fever.  You develop shortness of breath.  You develop burning on urination.  You loose more than 2 pounds of weight or gain more than 2 pounds of weight or as suggested by your caregiver.  You notice sudden swelling of your face, hands, and feet or legs.  You develop belly (abdominal) pain. Round ligament discomfort is a common non-cancerous (benign) cause of abdominal pain in pregnancy. Your caregiver still must evaluate you.  You develop a severe headache that does not go away.  You develop visual   problems, blurred or double vision.  If you have not felt your baby move for more than 1 hour. If you think the baby is not moving as much as usual, eat something with sugar in it and lie down on your left side for an hour. The baby should move at least 4 to 5 times per hour. Call right away if your baby moves less than that.  You fall, are in a car accident or any kind of trauma.  There is mental or physical violence at home. Document Released: 01/28/2001 Document Revised: 04/28/2011 Document Reviewed: 08/02/2008 [employer] Patient Information 2013 [employer].  Breastfeeding Deciding to breastfeed is one of the best choices you can make for you and your baby. The information that follows gives a brief overview of the benefits of breastfeeding as well as common topics surrounding breastfeeding. BENEFITS OF BREASTFEEDING For the baby  The first milk (colostrum) helps the baby's digestive system function better.   There are antibodies in the mother's milk that help the baby fight off infections.   The baby has a lower incidence of asthma, allergies, and sudden infant death syndrome (SIDS).   The nutrients in breast milk are better for the baby than infant formulas, and breast milk helps the baby's brain grow better.   Babies who  breastfeed have less gas, colic, and constipation.  For the mother  Breastfeeding helps develop a very special bond between the mother and her baby.   Breastfeeding is convenient, always available at the correct temperature, and costs nothing.   Breastfeeding burns calories in the mother and helps her lose weight that was gained during pregnancy.   Breastfeeding makes the uterus contract back down to normal size faster and slows bleeding following delivery.   Breastfeeding mothers have a lower risk of developing breast cancer.  BREASTFEEDING FREQUENCY  A healthy, full-term baby may breastfeed as often as every hour or space his or her feedings to every 3 hours.   Watch your baby for signs of hunger. Nurse your baby if he or she shows signs of hunger. How often you nurse will vary from baby to baby.   Nurse as often as the baby requests, or when you feel the need to reduce the fullness of your breasts.   Awaken the baby if it has been 3 4 hours since the last feeding.   Frequent feeding will help the mother make more milk and will help prevent problems, such as sore nipples and engorgement of the breasts.  BABY'S POSITION AT THE BREAST  Whether lying down or sitting, be sure that the baby's tummy is facing your tummy.   Support the breast with 4 fingers underneath the breast and the thumb above. Make sure your fingers are well away from the nipple and baby's mouth.   Stroke the baby's lips gently with your finger or nipple.   When the baby's mouth is open wide enough, place all of your nipple and as much of the areola as possible into your baby's mouth.   Pull the baby in close so the tip of the nose and the baby's cheeks touch the breast during the feeding.  FEEDINGS AND SUCTION  The length of each feeding varies from baby to baby and from feeding to feeding.   The baby must suck about 2 3 minutes for your milk to get to him or her. This is called a "let down."  For this reason, allow the baby to feed on each breast as   long as he or she wants. Your baby will end the feeding when he or she has received the right balance of nutrients.   To break the suction, put your finger into the corner of the baby's mouth and slide it between his or her gums before removing your breast from his or her mouth. This will help prevent sore nipples.  HOW TO TELL WHETHER YOUR BABY IS GETTING ENOUGH BREAST MILK. Wondering whether or not your baby is getting enough milk is a common concern among mothers. You can be assured that your baby is getting enough milk if:   Your baby is actively sucking and you hear swallowing.   Your baby seems relaxed and satisfied after a feeding.   Your baby nurses at least 8 12 times in a 24 hour time period. Nurse your baby until he or she unlatches or falls asleep at the first breast (at least 10 20 minutes), then offer the second side.   Your baby is wetting 5 6 disposable diapers (6 8 cloth diapers) in a 24 hour period by 5 6 days of age.   Your baby is having at least 3 4 stools every 24 hours for the first 6 weeks. The stool should be soft and yellow.   Your baby should gain 4 7 ounces per week after he or she is 4 days old.   Your breasts feel softer after nursing.  REDUCING BREAST ENGORGEMENT  In the first week after your baby is born, you may experience signs of breast engorgement. When breasts are engorged, they feel heavy, warm, full, and may be tender to the touch. You can reduce engorgement if you:   Nurse frequently, every 2 3 hours. Mothers who breastfeed early and often have fewer problems with engorgement.   Place light ice packs on your breasts for 10 20 minutes between feedings. This reduces swelling. Wrap the ice packs in a lightweight towel to protect your skin. Bags of frozen vegetables work well for this purpose.   Take a warm shower or apply warm, moist heat to your breast for 5 10 minutes just before  each feeding. This increases circulation and helps the milk flow.   Gently massage your breast before and during the feeding. Using your finger tips, massage from the chest wall towards your nipple in a circular motion.   Make sure that the baby empties at least one breast at every feeding before switching sides.   Use a breast pump to empty the breasts if your baby is sleepy or not nursing well. You may also want to pump if you are returning to work oryou feel you are getting engorged.   Avoid bottle feeds, pacifiers, or supplemental feedings of water or juice in place of breastfeeding. Breast milk is all the food your baby needs. It is not necessary for your baby to have water or formula. In fact, to help your breasts make more milk, it is best not to give your baby supplemental feedings during the early weeks.   Be sure the baby is latched on and positioned properly while breastfeeding.   Wear a supportive bra, avoiding underwire styles.   Eat a balanced diet with enough fluids.   Rest often, relax, and take your prenatal vitamins to prevent fatigue, stress, and anemia.  If you follow these suggestions, your engorgement should improve in 24 48 hours. If you are still experiencing difficulty, call your lactation consultant or caregiver.  CARING FOR YOURSELF Take care of your   breasts  Bathe or shower daily.   Avoid using soap on your nipples.   Start feedings on your left breast at one feeding and on your right breast at the next feeding.   You will notice an increase in your milk supply 2 5 days after delivery. You may feel some discomfort from engorgement, which makes your breasts very firm and often tender. Engorgement "peaks" out within 24 48 hours. In the meantime, apply warm moist towels to your breasts for 5 10 minutes before feeding. Gentle massage and expression of some milk before feeding will soften your breasts, making it easier for your baby to latch on.    Wear a well-fitting nursing bra, and air dry your nipples for a 3 4minutes after each feeding.   Only use cotton bra pads.   Only use pure lanolin on your nipples after nursing. You do not need to wash it off before feeding the baby again. Another option is to express a few drops of breast milk and gently massage it into your nipples.  Take care of yourself  Eat well-balanced meals and nutritious snacks.   Drinking milk, fruit juice, and water to satisfy your thirst (about 8 glasses a day).   Get plenty of rest.  Avoid foods that you notice affect the baby in a bad way.  SEEK MEDICAL CARE IF:   You have difficulty with breastfeeding and need help.   You have a hard, red, sore area on your breast that is accompanied by a fever.   Your baby is too sleepy to eat well or is having trouble sleeping.   Your baby is wetting less than 6 diapers a day, by 5 days of age.   Your baby's skin or white part of his or her eyes is more yellow than it was in the hospital.   You feel depressed.  Document Released: 02/03/2005 Document Revised: 08/05/2011 Document Reviewed: 05/04/2011 [employer] Patient Information 2013 [employer].  

## 2012-03-04 NOTE — Progress Notes (Signed)
P=95, c/o trace edema in feet only, c/o pelvic pressure sometimes, states feels contractions everyday sometimes last hours at a time, Given new patient information, disussed bmi and appropriate weight gain.Discussed need for tdap and flu shot, patient undecided.

## 2012-03-05 ENCOUNTER — Encounter: Payer: Self-pay | Admitting: Family Medicine

## 2012-03-05 LAB — RPR

## 2012-03-08 ENCOUNTER — Inpatient Hospital Stay (HOSPITAL_COMMUNITY)
Admission: AD | Admit: 2012-03-08 | Discharge: 2012-03-10 | DRG: 778 | Disposition: A | Discharge status: Home / Self Care | Payer: Medicaid Other | Source: Ambulatory Visit | Attending: Obstetrics & Gynecology | Admitting: Obstetrics & Gynecology

## 2012-03-08 ENCOUNTER — Encounter (HOSPITAL_COMMUNITY): Payer: Self-pay

## 2012-03-08 DIAGNOSIS — O99891 Other specified diseases and conditions complicating pregnancy: Secondary | ICD-10-CM | POA: Diagnosis present

## 2012-03-08 DIAGNOSIS — K219 Gastro-esophageal reflux disease without esophagitis: Secondary | ICD-10-CM | POA: Diagnosis present

## 2012-03-08 DIAGNOSIS — O99519 Diseases of the respiratory system complicating pregnancy, unspecified trimester: Secondary | ICD-10-CM

## 2012-03-08 DIAGNOSIS — O4700 False labor before 37 completed weeks of gestation, unspecified trimester: Principal | ICD-10-CM

## 2012-03-08 DIAGNOSIS — J45909 Unspecified asthma, uncomplicated: Secondary | ICD-10-CM

## 2012-03-08 DIAGNOSIS — O0930 Supervision of pregnancy with insufficient antenatal care, unspecified trimester: Secondary | ICD-10-CM

## 2012-03-08 DIAGNOSIS — F5089 Other specified eating disorder: Secondary | ICD-10-CM | POA: Diagnosis present

## 2012-03-08 LAB — URINALYSIS, ROUTINE W REFLEX MICROSCOPIC
Bilirubin Urine: NEGATIVE
Hgb urine dipstick: NEGATIVE
Ketones, ur: 15 mg/dL — AB
Nitrite: NEGATIVE
Urobilinogen, UA: 0.2 mg/dL (ref 0.0–1.0)
pH: 6 (ref 5.0–8.0)

## 2012-03-08 LAB — URINE MICROSCOPIC-ADD ON

## 2012-03-08 NOTE — MAU Note (Signed)
Contractions 4-5 times per hour x 2 days. Lower abdominal pressure x 2 days. Denies leaking of fluid or vaginal bleeding.

## 2012-03-09 ENCOUNTER — Encounter (HOSPITAL_COMMUNITY): Payer: Self-pay | Admitting: *Deleted

## 2012-03-09 DIAGNOSIS — J45909 Unspecified asthma, uncomplicated: Secondary | ICD-10-CM

## 2012-03-09 DIAGNOSIS — O4700 False labor before 37 completed weeks of gestation, unspecified trimester: Secondary | ICD-10-CM | POA: Diagnosis present

## 2012-03-09 LAB — CBC
HCT: 32.4 % — ABNORMAL LOW (ref 36.0–46.0)
MCH: 29.9 pg (ref 26.0–34.0)
MCV: 88 fL (ref 78.0–100.0)
Platelets: 166 10*3/uL (ref 150–400)
RDW: 12.2 % (ref 11.5–15.5)

## 2012-03-09 MED ORDER — NIFEDIPINE 10 MG PO CAPS
20.0000 mg | ORAL_CAPSULE | Freq: Once | ORAL | Status: AC
  Administered 2012-03-09: 20 mg via ORAL
  Filled 2012-03-09: qty 2

## 2012-03-09 MED ORDER — LACTATED RINGERS IV SOLN
INTRAVENOUS | Status: DC
  Administered 2012-03-09 (×3): via INTRAVENOUS

## 2012-03-09 MED ORDER — NIFEDIPINE 10 MG PO CAPS
20.0000 mg | ORAL_CAPSULE | ORAL | Status: DC | PRN
  Administered 2012-03-09: 20 mg via ORAL
  Filled 2012-03-09: qty 2

## 2012-03-09 MED ORDER — CALCIUM CARBONATE ANTACID 500 MG PO CHEW: 2.0000 | CHEWABLE_TABLET | ORAL | Status: DC | PRN

## 2012-03-09 MED ORDER — ACETAMINOPHEN 325 MG PO TABS
650.0000 mg | ORAL_TABLET | ORAL | Status: DC | PRN
  Administered 2012-03-09 – 2012-03-10 (×3): 650 mg via ORAL
  Filled 2012-03-09 (×3): qty 2

## 2012-03-09 MED ORDER — DOCUSATE SODIUM 100 MG PO CAPS
100.0000 mg | ORAL_CAPSULE | Freq: Every day | ORAL | Status: DC
  Administered 2012-03-09: 100 mg via ORAL
  Filled 2012-03-09: qty 1

## 2012-03-09 MED ORDER — NIFEDIPINE ER 30 MG PO TB24
30.0000 mg | ORAL_TABLET | Freq: Two times a day (BID) | ORAL | Status: DC
  Administered 2012-03-09 – 2012-03-10 (×3): 30 mg via ORAL
  Filled 2012-03-09 (×3): qty 1

## 2012-03-09 MED ORDER — NIFEDIPINE 10 MG PO CAPS: 10.0000 mg | ORAL_CAPSULE | Freq: Four times a day (QID) | ORAL | Status: DC

## 2012-03-09 MED ORDER — BETAMETHASONE SOD PHOS & ACET 6 (3-3) MG/ML IJ SUSP
12.0000 mg | INTRAMUSCULAR | Status: AC
  Administered 2012-03-09 – 2012-03-10 (×2): 12 mg via INTRAMUSCULAR
  Filled 2012-03-09 (×2): qty 2

## 2012-03-09 MED ORDER — PRENATAL MULTIVITAMIN CH
1.0000 | ORAL_TABLET | Freq: Every day | ORAL | Status: DC
  Administered 2012-03-09: 1 via ORAL
  Filled 2012-03-09: qty 1

## 2012-03-09 MED ORDER — ZOLPIDEM TARTRATE 5 MG PO TABS
5.0000 mg | ORAL_TABLET | Freq: Every evening | ORAL | Status: DC | PRN
  Administered 2012-03-09: 5 mg via ORAL
  Filled 2012-03-09: qty 1

## 2012-03-09 NOTE — H&P (Signed)
Marissa Lucas is a 22 y.o. G2P1001 at [redacted]w[redacted]d who presents to the MAU due to back and abdominal cramping for the last four days. She describes the pains as seeming to originate in her back and radiate to her abdomen, resembling contraction pains. She is not able to give an exact timing, but has noticed that the pains are increasing in frequency, and are now occuring about four times an hour. She has not noted any bleeding or loss of fluid, and continues to feel good fetal movement. She has not had intercourse in "months".  She was seen in the MAU on 1/8 for vaginal spotting, was diagnosed with bacterial vaginosis, and given prescriptions for flagyl and procardia. She completed the flagyl course, and took one dose of Procardia this morning, which did not abate her pains. She states that she was seen in clinic last week and was told that she was not dilated.   Maternal Medical History:  Reason for admission: Reason for admission: contractions.  Reason for Admission:   nauseaContractions: Onset was more than 2 days ago.   Frequency: irregular.   Perceived severity is moderate.    Fetal activity: Perceived fetal activity is normal.   Last perceived fetal movement was within the past hour.      OB History    Grav Para Term Preterm Abortions TAB SAB Ect Mult Living   2 1 1       1      Past Medical History  Diagnosis Date  . Asthma   . Anxiety   . GERD (gastroesophageal reflux disease)    Past Surgical History  Procedure Date  . No past surgeries    Family History: family history includes Arthritis in her maternal grandmother; Asthma in her maternal grandmother; Diabetes in her maternal grandmother and mother; Heart disease in her maternal grandmother and mother; Hypertension in her maternal grandmother and mother; Kidney disease in her mother; and Stroke in her maternal grandmother.  There is no history of Other. Social History:  reports that she has never smoked. She has never used smokeless  tobacco. She reports that she does not drink alcohol or use illicit drugs.   Prenatal Transfer Tool  Maternal Diabetes: No Genetic Screening: Normal Maternal Ultrasounds/Referrals: Normal Fetal Ultrasounds or other Referrals:  None Maternal Substance Abuse:  No Significant Maternal Medications:  None Significant Maternal Lab Results:  Lab values include: Other:  unknown GBS and Rh Other Comments:  None  Review of Systems  Constitutional: Negative for fever.  Eyes: Negative for blurred vision.  Respiratory: Negative for cough and wheezing.   Cardiovascular: Negative for chest pain.  Gastrointestinal: Positive for abdominal pain. Negative for nausea, vomiting, diarrhea and constipation.  Genitourinary: Negative for dysuria.  Musculoskeletal: Positive for back pain.  Neurological: Negative for dizziness, tingling and headaches.    Dilation: 2 Effacement (%): 60 Station: -2 Exam by:: Lucy Chris RNC Blood pressure 113/67, pulse 98, temperature 98.5 F (36.9 C), temperature source Oral, resp. rate 18, height 5\' 7"  (1.702 m), weight 79.107 kg (174 lb 6.4 oz), last menstrual period 07/19/2011. Maternal Exam:  Uterine Assessment: Contraction strength is moderate.  Contraction duration is 50 seconds. Contraction frequency is irregular.  Contractions every 4-5 minutes  Cervix: Cervix evaluated by digital exam.     Fetal Exam Fetal Monitor Review: Baseline rate: 130.  Variability: moderate (6-25 bpm).   Pattern: accelerations present and no decelerations.    Fetal State Assessment: Category I - tracings are normal.  Physical Exam  Constitutional: She is oriented to person, place, and time. She appears well-developed and well-nourished. No distress.  HENT:  Head: Normocephalic and atraumatic.  Eyes: Conjunctivae normal are normal. Pupils are equal, round, and reactive to light.  Neck: Normal range of motion. Neck supple.  Cardiovascular: Normal rate, regular rhythm, normal  heart sounds and intact distal pulses.   No murmur heard. Respiratory: Effort normal and breath sounds normal. She has no wheezes.  GI: There is no tenderness.       gravid  Musculoskeletal: Normal range of motion. She exhibits no edema and no tenderness.  Neurological: She is alert and oriented to person, place, and time.  Skin: Skin is warm and dry.  Psychiatric: She has a normal mood and affect. Her behavior is normal.    Prenatal labs: ABO, Rh: O/Negative/-- (07/30 0000) Antibody: Negative (07/30 0000) Rubella: Immune (07/30 0000) RPR: NON REAC (01/16 1101)  HBsAg: Negative (07/30 0000)  HIV: NON REACTIVE (01/16 1101)  GBS:   unknown  Assessment/Plan: 22 y.o. G2P1001 at [redacted]w[redacted]d with preterm contractions and cervical change from last week -Procardia - Betamethasone - GBS screen - FFN pending - unclear blood type, reported O negative, type and screen pending, then will assess for Rhogam needs    Marissa Lucas 03/09/2012, 12:17 AM

## 2012-03-09 NOTE — Progress Notes (Signed)
UR completed 

## 2012-03-09 NOTE — H&P (Signed)
I have seen and examined patient and agree with above. Contractions are q 3-5 minutes on monitor. Pt state that they are uncomfortable but bearable. She is dilated to 2/60/-2 today; the last time she was checked (1/8) she was 1/thick and high. Pt admitted for preterm labor. Will give procardia and titrate to adequate dose. Start antenatal corticosteroids. Magnesium for neuroprotection not indicated after 32 weeks. Antibiotics not indicated - not ruptured. FFN pending. Napoleon Form, MD

## 2012-03-09 NOTE — Progress Notes (Signed)
FACULTY PRACTICE ANTEPARTUM(COMPREHENSIVE) NOTE  Marissa Lucas is a 22 y.o. G2P1001 with Estimated Date of Delivery: 04/22/12  by second trimester ultrasound at [redacted]w[redacted]d  who is admitted for preterm contractions.    Fetal presentation is unsure. Length of Stay:  1  Days  Date of admission:03/08/2012  Subjective: Patient is doing well. Reports improvement in contractions but still present, good fetal movement, no vaginal bleeding or leakage of fluid Patient reports the fetal movement as active. Patient reports uterine contraction  activity as irregular. Patient reports  vaginal bleeding as none. Patient describes fluid per vagina as None.  Vitals:  Blood pressure 99/61, pulse 97, temperature 98.7 F (37.1 C), temperature source Oral, resp. rate 18, height 5\' 7"  (1.702 m), weight 174 lb (78.926 kg), last menstrual period 07/19/2011. Filed Vitals:   03/09/12 0104 03/09/12 0200 03/09/12 0300 03/09/12 0827  BP: 129/77   99/61  Pulse: 84   97  Temp: 98.2 F (36.8 C)   98.7 F (37.1 C)  TempSrc: Oral   Oral  Resp: 18 18 20 18   Height: 5\' 7"  (1.702 m)     Weight: 174 lb (78.926 kg)      Physical Examination:  General appearance - alert, well appearing, and in no distress Abdomen - soft, gravid, nontender Fundal Height:  size equals dates Pelvic Exam:  examination not indicated Cervical Exam: Not evaluated. Extremities: extremities normal, atraumatic, no cyanosis or edema and Homans sign is negative, no sign of DVT with DTRs 2+ bilaterally Membranes:intact  Fetal Monitoring:  Baseline: 120 bpm, Variability: Good {> 6 bpm), Accelerations: Reactive, Decelerations: Absent and Toco: contractions irregular q10-15 minutes  Labs:  Recent Results (from the past 24 hour(s))  URINALYSIS, ROUTINE W REFLEX MICROSCOPIC   Collection Time   03/08/12 10:44 PM      Component Value Range   Color, Urine YELLOW  YELLOW   APPearance CLEAR  CLEAR   Specific Gravity, Urine >1.030 (*) 1.005 - 1.030   pH 6.0   5.0 - 8.0   Glucose, UA NEGATIVE  NEGATIVE mg/dL   Hgb urine dipstick NEGATIVE  NEGATIVE   Bilirubin Urine NEGATIVE  NEGATIVE   Ketones, ur 15 (*) NEGATIVE mg/dL   Protein, ur NEGATIVE  NEGATIVE mg/dL   Urobilinogen, UA 0.2  0.0 - 1.0 mg/dL   Nitrite NEGATIVE  NEGATIVE   Leukocytes, UA SMALL (*) NEGATIVE  URINE MICROSCOPIC-ADD ON   Collection Time   03/08/12 10:44 PM      Component Value Range   Squamous Epithelial / LPF FEW (*) RARE   WBC, UA 7-10  <3 WBC/hpf   RBC / HPF 3-6  <3 RBC/hpf   Bacteria, UA FEW (*) RARE  FETAL FIBRONECTIN   Collection Time   03/09/12 12:11 AM      Component Value Range   Fetal Fibronectin NEGATIVE  NEGATIVE  CBC   Collection Time   03/09/12  1:20 AM      Component Value Range   WBC 10.0  4.0 - 10.5 K/uL   RBC 3.68 (*) 3.87 - 5.11 MIL/uL   Hemoglobin 11.0 (*) 12.0 - 15.0 g/dL   HCT 14.7 (*) 82.9 - 56.2 %   MCV 88.0  78.0 - 100.0 fL   MCH 29.9  26.0 - 34.0 pg   MCHC 34.0  30.0 - 36.0 g/dL   RDW 13.0  86.5 - 78.4 %   Platelets 166  150 - 400 K/uL  TYPE AND SCREEN   Collection Time  03/09/12  1:20 AM      Component Value Range   ABO/RH(D) O POS     Antibody Screen NEG     Sample Expiration 03/12/2012    ABO/RH   Collection Time   03/09/12  1:20 AM      Component Value Range   ABO/RH(D) O POS      Imaging Studies:     Medications:  Scheduled    . betamethasone acetate-betamethasone sodium phosphate  12 mg Intramuscular Q24H  . docusate sodium  100 mg Oral Daily  . prenatal multivitamin  1 tablet Oral Daily   I have reviewed the patient's current medications.  ASSESSMENT: Patient Active Problem List  Diagnosis  . Insufficient prenatal care  . Pica  . Supervision of normal subsequent pregnancy  . Asthma complicating pregnancy, antepartum  . Asthma  . GERD (gastroesophageal reflux disease)    PLAN: Continue tocolysis with procardia Patient to receive her second dose of betamethasone at 0045 Continue close  monitoring  Ela Moffat 03/09/2012,9:07 AM

## 2012-03-10 DIAGNOSIS — O4700 False labor before 37 completed weeks of gestation, unspecified trimester: Principal | ICD-10-CM

## 2012-03-10 LAB — URINE CULTURE

## 2012-03-10 MED ORDER — DSS 100 MG PO CAPS: 100.0000 mg | ORAL_CAPSULE | Freq: Two times a day (BID) | ORAL | Status: DC | PRN

## 2012-03-10 MED ORDER — SODIUM CHLORIDE 0.9 % IJ SOLN: 3.0000 mL | Freq: Two times a day (BID) | INTRAMUSCULAR | Status: DC

## 2012-03-10 NOTE — Discharge Summary (Signed)
Physician Discharge Summary  Patient ID: Marissa Lucas MRN: [id number] DOB/AGE: Apr 29, 1990 22 y.o.  Admit date: 03/08/2012 Discharge date: 03/10/2012  Admission Diagnoses: preterm contractions  Discharge Diagnoses: same Active Problems:  Preterm uterine contractions, antepartum   Discharged Condition: good  Hospital Course: Patient presented to MAU complaining of frequent contractions at [redacted]w[redacted]d and cervical dilation of 2 cm. Patient with prenatal care complicated by preterm contraction on procardia. Patient had not been taking procardia at home. Patient was admitted for continued tocolysis with procardia and betamethasone administration. Patient hospital course remained uneventful and fetal heart rate tracing remained reactive. Cervical exam on day of discharge remained unchanged. Patient stable for discharge with plans to follow-up in clinic on 1/29. Discharge instructions discussed with patient.  Consults: None  Significant Diagnostic Studies: none  Treatments: IV hydration, steroids: betamethasone and tocolysis with procardia  Discharge Exam: Blood pressure 120/57, pulse 87, temperature 98.3 F (36.8 C), temperature source Oral, resp. rate 18, height 5\' 7"  (1.702 m), weight 174 lb (78.926 kg), last menstrual period 07/19/2011. General appearance: alert, cooperative and no distress GI: soft, gravid, NT Pelvic: cervix: 1-2 cm/50/ballotable Extremities: Homans sign is negative, no sign of DVT and no edema, redness or tenderness in the calves or thighs FHT: baseline 130, moderate variability, +accels, no decels Toco: irregular contractions q3-20 minutes  Disposition: 01-Home or Self Care  Discharge Orders    Future Appointments: Provider: Department: Dept Phone: Center:   03/17/2012 10:15 AM Aviva Signs, CNM [facility] [phone] [facility]       Medication List     As of 03/10/2012  7:20 AM    TAKE these medications         acetaminophen 325 MG tablet   Commonly known as: TYLENOL   Take 325 mg by mouth every 6 (six) hours as needed.      ALBUTEROL IN   Inhale 2 puffs into the lungs daily as needed. For shortness of breath.      [employer] PRENATAL GUMMY PO   Take by mouth.      diphenhydrAMINE 25 MG tablet   Commonly known as: BENADRYL   Take 1 tablet (25 mg total) by mouth every 6 (six) hours as needed for itching (May take 50 mg at night if needed for severe itching).      DSS 100 MG Caps   Take 100 mg by mouth 2 (two) times daily as needed for constipation.      NIFEdipine 30 MG 24 hr tablet   Commonly known as: PROCARDIA-XL/ADALAT-CC/NIFEDICAL-XL   Take 1 tablet (30 mg total) by mouth daily as needed. For sensation of contractions      ondansetron 4 MG tablet   Commonly known as: ZOFRAN   Take 4 mg by mouth every 8 (eight) hours as needed. nausea           Follow-up Information    Please follow up. (Keep your scheduled appointment on 1/29 at 10 am. If symptoms worsen before next appointment please return to [facility] hospital)          Signed: Nakisha Chai 03/10/2012, 7:20 AM

## 2012-03-11 LAB — CULTURE, BETA STREP (GROUP B ONLY)

## 2012-03-16 ENCOUNTER — Inpatient Hospital Stay (HOSPITAL_COMMUNITY)
Admission: AD | Admit: 2012-03-16 | Discharge: 2012-03-16 | Disposition: A | Discharge status: Home / Self Care | Payer: Medicaid Other | Source: Ambulatory Visit | Attending: Family Medicine | Admitting: Family Medicine

## 2012-03-16 ENCOUNTER — Encounter (HOSPITAL_COMMUNITY): Payer: Self-pay | Admitting: *Deleted

## 2012-03-16 DIAGNOSIS — O4700 False labor before 37 completed weeks of gestation, unspecified trimester: Secondary | ICD-10-CM

## 2012-03-16 LAB — URINALYSIS, ROUTINE W REFLEX MICROSCOPIC
Bilirubin Urine: NEGATIVE
Hgb urine dipstick: NEGATIVE
Nitrite: NEGATIVE
Specific Gravity, Urine: 1.02 (ref 1.005–1.030)
pH: 6.5 (ref 5.0–8.0)

## 2012-03-16 MED ORDER — NIFEDIPINE ER OSMOTIC RELEASE 30 MG PO TB24
30.0000 mg | ORAL_TABLET | Freq: Once | ORAL | Status: AC
  Administered 2012-03-16: 30 mg via ORAL
  Filled 2012-03-16: qty 1

## 2012-03-16 MED ORDER — NIFEDIPINE ER OSMOTIC RELEASE 30 MG PO TB24
30.0000 mg | ORAL_TABLET | Freq: Every day | ORAL | Status: DC | PRN
Start: 2012-03-16 — End: 2012-04-05

## 2012-03-16 NOTE — MAU Note (Signed)
Mucus discharge for 3 days with contractions today more than 5 in one hour, denies vaginal bleeding, positive FM.

## 2012-03-16 NOTE — MAU Provider Note (Signed)
History    Ms. Heffernan is a 22 y.o. G2P1001 at [redacted]w[redacted]d who presents to the MAU for increasing contractions over the last several hours. She has a history of preterm contractions, and was hospitalized last week for preterm labor, during which she received two doses of betamethazone. She has been on procardia for several weeks, and is now out and requests a refill, as it does help to reduce her contractions.  She has not noted any bleeding, loss of fluid, and continues to feel good fetal movement.    CSN: [id number]  Arrival date and time: 03/16/12 1547   First Provider Initiated Contact with Patient 03/16/12 1633      Chief Complaint  Patient presents with  . Contractions   HPI  OB History    Grav Para Term Preterm Abortions TAB SAB Ect Mult Living   2 1 1       1       Past Medical History  Diagnosis Date  . Asthma   . Anxiety   . GERD (gastroesophageal reflux disease)     Past Surgical History  Procedure Date  . Vaginal delivery     X 1, no complications    Family History  Problem Relation Age of Onset  . Other Neg Hx   . Diabetes Mother   . Heart disease Mother   . Hypertension Mother   . Kidney disease Mother   . Arthritis Maternal Grandmother   . Asthma Maternal Grandmother   . Diabetes Maternal Grandmother   . Heart disease Maternal Grandmother   . Hypertension Maternal Grandmother   . Stroke Maternal Grandmother     History  Substance Use Topics  . Smoking status: Never Smoker   . Smokeless tobacco: Never Used  . Alcohol Use: No    Allergies: No Known Allergies  No prescriptions prior to admission    Review of Systems  Constitutional: Negative for fever.  Eyes: Negative for blurred vision.  Respiratory: Negative for cough and wheezing.   Cardiovascular: Negative for chest pain.  Gastrointestinal: Negative for nausea, vomiting, diarrhea and constipation.  Genitourinary: Negative for dysuria and hematuria.  Musculoskeletal: Negative for back  pain.  Neurological: Negative for dizziness, tingling and headaches.   Physical Exam   Blood pressure 116/81, pulse 90, temperature 98 F (36.7 C), temperature source Oral, resp. rate 18, height 5\' 7"  (1.702 m), weight 80.015 kg (176 lb 6.4 oz), last menstrual period 07/19/2011.  Physical Exam  Constitutional: She is oriented to person, place, and time. She appears well-developed and well-nourished. No distress.  HENT:  Head: Normocephalic and atraumatic.  Eyes: Pupils are equal, round, and reactive to light.  Neck: Normal range of motion.  Cardiovascular: Normal rate and regular rhythm.   Respiratory: Effort normal and breath sounds normal. No respiratory distress. She has no wheezes.  GI: There is no tenderness.       Gravid  Genitourinary: Vagina normal.  Musculoskeletal: Normal range of motion. She exhibits no edema.  Neurological: She is alert and oriented to person, place, and time.  Skin: Skin is warm and dry.  Psychiatric: She has a normal mood and affect. Her behavior is normal.   Dilation: 2 Effacement (%): 70 Cervical Position: Posterior Station: -2 Presentation: Vertex Exam by:: Sarajane Marek, RNC/Dr. Anna Genre  FHR: 130, +accels, no decels Toco: contractions every 4-5 minutes  MAU Course  Procedures Results for orders placed during the hospital encounter of 03/16/12 (from the past 24 hour(s))  URINALYSIS, ROUTINE W REFLEX  MICROSCOPIC     Status: Normal   Collection Time   03/16/12  3:55 PM      Component Value Range   Color, Urine YELLOW  YELLOW   APPearance CLEAR  CLEAR   Specific Gravity, Urine 1.020  1.005 - 1.030   pH 6.5  5.0 - 8.0   Glucose, UA NEGATIVE  NEGATIVE mg/dL   Hgb urine dipstick NEGATIVE  NEGATIVE   Bilirubin Urine NEGATIVE  NEGATIVE   Ketones, ur NEGATIVE  NEGATIVE mg/dL   Protein, ur NEGATIVE  NEGATIVE mg/dL   Urobilinogen, UA 0.2  0.0 - 1.0 mg/dL   Nitrite NEGATIVE  NEGATIVE   Leukocytes, UA NEGATIVE  NEGATIVE     Assessment and Plan    Ms. Win is a 22 y.o. G2P1001 at [redacted]w[redacted]d who presents due to increasing frequency of contractions.  Repeat cervical exams demonstrated no change in over an hour, and very little change from when she was inpatient a week ago.  She has not noted any leakage of fluid, and cervical exam was not demonstrative of ROM.  She has had some success with procardia for tocolysis, so she was administered procardia, given a prescription for the same, and discharged home with instructions for labor precautions and when to return to MAU.      Bonnita Nasuti 03/16/2012, 6:37 PM

## 2012-03-17 ENCOUNTER — Encounter: Payer: Self-pay | Admitting: Advanced Practice Midwife

## 2012-03-17 ENCOUNTER — Ambulatory Visit (INDEPENDENT_AMBULATORY_CARE_PROVIDER_SITE_OTHER): Payer: Medicaid Other | Admitting: Advanced Practice Midwife

## 2012-03-17 VITALS — BP 115/74 | Temp 97.2°F | Wt 175.6 lb

## 2012-03-17 DIAGNOSIS — O4700 False labor before 37 completed weeks of gestation, unspecified trimester: Secondary | ICD-10-CM

## 2012-03-17 LAB — POCT URINALYSIS DIP (DEVICE)
Bilirubin Urine: NEGATIVE
Glucose, UA: NEGATIVE mg/dL
Ketones, ur: NEGATIVE mg/dL
Nitrite: NEGATIVE
pH: 7 (ref 5.0–8.0)

## 2012-03-17 NOTE — MAU Provider Note (Signed)
I saw and examined patient, reviewed her recent hospital records, labs and imaging and reviewed fetal heart tracing, which was reassuring. I agree with above note. Patient not in labor. Discussed labor precautions with patient in depth. Napoleon Form, MD

## 2012-03-17 NOTE — Progress Notes (Signed)
Doing well. Fewer contractions today. Seen this week for contractions with no cervical change. Discussed we don't need to use tocolysis anymore.

## 2012-03-17 NOTE — Progress Notes (Signed)
Pulse 111  

## 2012-03-17 NOTE — Patient Instructions (Signed)
Normal Labor and Delivery  Your caregiver must first be sure you are in labor. Signs of labor include:  · You may pass what is called "the mucus plug" before labor begins. This is a small amount of blood stained mucus.  · Regular uterine contractions.  · The time between contractions get closer together.  · The discomfort and pain gradually gets more intense.  · Pains are mostly located in the back.  · Pains get worse when walking.  · The cervix (the opening of the uterus becomes thinner (begins to efface) and opens up (dilates).  Once you are in labor and admitted into the hospital or care center, your caregiver will do the following:  · A complete physical examination.  · Check your vital signs (blood pressure, pulse, temperature and the fetal heart rate).  · Do a vaginal examination (using a sterile glove and lubricant) to determine:  · The position (presentation) of the baby (head [vertex] or buttock first).  · The level (station) of the baby's head in the birth canal.  · The effacement and dilatation of the cervix.  · You may have your pubic hair shaved and be given an enema depending on your caregiver and the circumstance.  · An electronic monitor is usually placed on your abdomen. The monitor follows the length and intensity of the contractions, as well as the baby's heart rate.  · Usually, your caregiver will insert an IV in your arm with a bottle of sugar water. This is done as a precaution so that medications can be given to you quickly during labor or delivery.  NORMAL LABOR AND DELIVERY IS DIVIDED UP INTO 3 STAGES:  First Stage  This is when regular contractions begin and the cervix begins to efface and dilate. This stage can last from 3 to 15 hours. The end of the first stage is when the cervix is 100% effaced and 10 centimeters dilated. Pain medications may be given by   · Injection (morphine, demerol, etc.)  · Regional anesthesia (spinal, caudal or epidural, anesthetics given in different locations of  the spine). Paracervical pain medication may be given, which is an injection of and anesthetic on each side of the cervix.  A pregnant woman may request to have "Natural Childbirth" which is not to have any medications or anesthesia during her labor and delivery.  Second Stage  This is when the baby comes down through the birth canal (vagina) and is born. This can take 1 to 4 hours. As the baby's head comes down through the birth canal, you may feel like you are going to have a bowel movement. You will get the urge to bear down and push until the baby is delivered. As the baby's head is being delivered, the caregiver will decide if an episiotomy (a cut in the perineum and vagina area) is needed to prevent tearing of the tissue in this area. The episiotomy is sewn up after the delivery of the baby and placenta. Sometimes a mask with nitrous oxide is given for the mother to breath during the delivery of the baby to help if there is too much pain. The end of Stage 2 is when the baby is fully delivered. Then when the umbilical cord stops pulsating it is clamped and cut.  Third Stage  The third stage begins after the baby is completely delivered and ends after the placenta (afterbirth) is delivered. This usually takes 5 to 30 minutes. After the placenta is delivered, a medication   is given either by intravenous or injection to help contract the uterus and prevent bleeding. The third stage is not painful and pain medication is usually not necessary. If an episiotomy was done, it is repaired at this time.  After the delivery, the mother is watched and monitored closely for 1 to 2 hours to make sure there is no postpartum bleeding (hemorrhage). If there is a lot of bleeding, medication is given to contract the uterus and stop the bleeding.  Document Released: 11/13/2007 Document Revised: 04/28/2011 Document Reviewed: 11/13/2007  Exit[employer]® Patient Information ©2013 [employer].

## 2012-03-31 ENCOUNTER — Ambulatory Visit (INDEPENDENT_AMBULATORY_CARE_PROVIDER_SITE_OTHER): Payer: Medicaid Other | Admitting: Advanced Practice Midwife

## 2012-03-31 ENCOUNTER — Encounter: Payer: Self-pay | Admitting: Advanced Practice Midwife

## 2012-03-31 VITALS — BP 118/77 | Temp 97.8°F | Wt 177.0 lb

## 2012-03-31 DIAGNOSIS — Z1389 Encounter for screening for other disorder: Secondary | ICD-10-CM

## 2012-03-31 DIAGNOSIS — B951 Streptococcus, group B, as the cause of diseases classified elsewhere: Secondary | ICD-10-CM

## 2012-03-31 DIAGNOSIS — O0930 Supervision of pregnancy with insufficient antenatal care, unspecified trimester: Secondary | ICD-10-CM

## 2012-03-31 DIAGNOSIS — Z3490 Encounter for supervision of normal pregnancy, unspecified, unspecified trimester: Secondary | ICD-10-CM

## 2012-03-31 DIAGNOSIS — A491 Streptococcal infection, unspecified site: Secondary | ICD-10-CM

## 2012-03-31 DIAGNOSIS — Z3480 Encounter for supervision of other normal pregnancy, unspecified trimester: Secondary | ICD-10-CM

## 2012-03-31 LAB — POCT URINALYSIS DIP (DEVICE)
Bilirubin Urine: NEGATIVE
Ketones, ur: NEGATIVE mg/dL
Protein, ur: NEGATIVE mg/dL
Specific Gravity, Urine: 1.025 (ref 1.005–1.030)

## 2012-03-31 MED ORDER — OMEPRAZOLE 20 MG PO CPDR: 20.0000 mg | DELAYED_RELEASE_CAPSULE | Freq: Every day | ORAL | Status: DC

## 2012-03-31 NOTE — Patient Instructions (Addendum)
Normal Labor and Delivery  Your caregiver must first be sure you are in labor. Signs of labor include:  · You may pass what is called "the mucus plug" before labor begins. This is a small amount of blood stained mucus.  · Regular uterine contractions.  · The time between contractions get closer together.  · The discomfort and pain gradually gets more intense.  · Pains are mostly located in the back.  · Pains get worse when walking.  · The cervix (the opening of the uterus becomes thinner (begins to efface) and opens up (dilates).  Once you are in labor and admitted into the hospital or care center, your caregiver will do the following:  · A complete physical examination.  · Check your vital signs (blood pressure, pulse, temperature and the fetal heart rate).  · Do a vaginal examination (using a sterile glove and lubricant) to determine:  · The position (presentation) of the baby (head [vertex] or buttock first).  · The level (station) of the baby's head in the birth canal.  · The effacement and dilatation of the cervix.  · You may have your pubic hair shaved and be given an enema depending on your caregiver and the circumstance.  · An electronic monitor is usually placed on your abdomen. The monitor follows the length and intensity of the contractions, as well as the baby's heart rate.  · Usually, your caregiver will insert an IV in your arm with a bottle of sugar water. This is done as a precaution so that medications can be given to you quickly during labor or delivery.  NORMAL LABOR AND DELIVERY IS DIVIDED UP INTO 3 STAGES:  First Stage  This is when regular contractions begin and the cervix begins to efface and dilate. This stage can last from 3 to 15 hours. The end of the first stage is when the cervix is 100% effaced and 10 centimeters dilated. Pain medications may be given by   · Injection (morphine, demerol, etc.)  · Regional anesthesia (spinal, caudal or epidural, anesthetics given in different locations of  the spine). Paracervical pain medication may be given, which is an injection of and anesthetic on each side of the cervix.  A pregnant woman may request to have "Natural Childbirth" which is not to have any medications or anesthesia during her labor and delivery.  Second Stage  This is when the baby comes down through the birth canal (vagina) and is born. This can take 1 to 4 hours. As the baby's head comes down through the birth canal, you may feel like you are going to have a bowel movement. You will get the urge to bear down and push until the baby is delivered. As the baby's head is being delivered, the caregiver will decide if an episiotomy (a cut in the perineum and vagina area) is needed to prevent tearing of the tissue in this area. The episiotomy is sewn up after the delivery of the baby and placenta. Sometimes a mask with nitrous oxide is given for the mother to breath during the delivery of the baby to help if there is too much pain. The end of Stage 2 is when the baby is fully delivered. Then when the umbilical cord stops pulsating it is clamped and cut.  Third Stage  The third stage begins after the baby is completely delivered and ends after the placenta (afterbirth) is delivered. This usually takes 5 to 30 minutes. After the placenta is delivered, a medication   is given either by intravenous or injection to help contract the uterus and prevent bleeding. The third stage is not painful and pain medication is usually not necessary. If an episiotomy was done, it is repaired at this time.  After the delivery, the mother is watched and monitored closely for 1 to 2 hours to make sure there is no postpartum bleeding (hemorrhage). If there is a lot of bleeding, medication is given to contract the uterus and stop the bleeding.  Document Released: 11/13/2007 Document Revised: 04/28/2011 Document Reviewed: 11/13/2007  Exit[employer]® Patient Information ©2013 [employer].

## 2012-03-31 NOTE — Progress Notes (Signed)
Will do GBS again today.  No need to repeat cultures, just done in January

## 2012-03-31 NOTE — Progress Notes (Signed)
Pulse 81 C/o intermittent epigastric pain. C/o pressure in vaginal and rectal area.

## 2012-04-03 ENCOUNTER — Other Ambulatory Visit: Payer: Self-pay

## 2012-04-04 ENCOUNTER — Encounter (HOSPITAL_COMMUNITY): Payer: Self-pay | Admitting: *Deleted

## 2012-04-04 ENCOUNTER — Inpatient Hospital Stay (HOSPITAL_COMMUNITY)
Admission: AD | Admit: 2012-04-04 | Discharge: 2012-04-05 | Disposition: A | Discharge status: Home / Self Care | Payer: Medicaid Other | Source: Ambulatory Visit | Attending: Obstetrics & Gynecology | Admitting: Obstetrics & Gynecology

## 2012-04-04 DIAGNOSIS — M6208 Separation of muscle (nontraumatic), other site: Secondary | ICD-10-CM

## 2012-04-04 DIAGNOSIS — R1013 Epigastric pain: Secondary | ICD-10-CM | POA: Insufficient documentation

## 2012-04-04 DIAGNOSIS — A491 Streptococcal infection, unspecified site: Secondary | ICD-10-CM | POA: Insufficient documentation

## 2012-04-04 LAB — CBC
HCT: 30.6 % — ABNORMAL LOW (ref 36.0–46.0)
MCH: 28.9 pg (ref 26.0–34.0)
MCHC: 33 g/dL (ref 30.0–36.0)
MCV: 87.7 fL (ref 78.0–100.0)
Platelets: 158 10*3/uL (ref 150–400)
RDW: 12.6 % (ref 11.5–15.5)

## 2012-04-04 LAB — COMPREHENSIVE METABOLIC PANEL
ALT: 9 U/L (ref 0–35)
AST: 26 U/L (ref 0–37)
Albumin: 2.5 g/dL — ABNORMAL LOW (ref 3.5–5.2)
Alkaline Phosphatase: 172 U/L — ABNORMAL HIGH (ref 39–117)
BUN: 7 mg/dL (ref 6–23)
Chloride: 101 mEq/L (ref 96–112)
Potassium: 3.8 mEq/L (ref 3.5–5.1)
Sodium: 134 mEq/L — ABNORMAL LOW (ref 135–145)
Total Bilirubin: 0.2 mg/dL — ABNORMAL LOW (ref 0.3–1.2)
Total Protein: 6.5 g/dL (ref 6.0–8.3)

## 2012-04-04 MED ORDER — GI COCKTAIL ~~LOC~~
30.0000 mL | Freq: Once | ORAL | Status: AC
  Administered 2012-04-04: 30 mL via ORAL
  Filled 2012-04-04: qty 30

## 2012-04-04 NOTE — MAU Note (Signed)
Pt c/o of constant pain across the top of her fundus-does not hurt when she is still-states is painful when she moves -note it palpates soft to the touch-pt states it feels sore when touched

## 2012-04-04 NOTE — MAU Provider Note (Signed)
Chief Complaint:  Abdominal Pain   First Provider Initiated Contact with Patient 04/04/12 2156     HPI: Marissa Lucas is a 22 y.o. G2P1001 at [redacted]w[redacted]d who presents to maternity admissions reporting epigastric pain x 2 weeks, worse w/ straining and postion changes. Few episodes of vomiting not C/W N/V of pregnancy. No relation to pain or eating.    Past Medical History: Past Medical History  Diagnosis Date  . Asthma   . Anxiety   . GERD (gastroesophageal reflux disease)     Past obstetric history: OB History   Grav Para Term Preterm Abortions TAB SAB Ect Mult Living   2 1 1       1      # Outc Date GA Lbr Len/2nd Wgt Sex Del Anes PTL Lv   1 TRM 8/09 [redacted]w[redacted]d  3.005kg(6lb10oz) M SVD EPI  Yes   Comments: no complications, born at [facility] in [place] , [place]   2 CUR               Past Surgical History: Past Surgical History  Procedure Laterality Date  . Vaginal delivery      X 1, no complications    Family History: Family History  Problem Relation Age of Onset  . Other Neg Hx   . Diabetes Mother   . Heart disease Mother   . Hypertension Mother   . Kidney disease Mother   . Arthritis Maternal Grandmother   . Asthma Maternal Grandmother   . Diabetes Maternal Grandmother   . Heart disease Maternal Grandmother   . Hypertension Maternal Grandmother   . Stroke Maternal Grandmother     Social History: History  Substance Use Topics  . Smoking status: Never Smoker   . Smokeless tobacco: Never Used  . Alcohol Use: No    Allergies: No Known Allergies  Meds:  Prescriptions prior to admission  Medication Sig Dispense Refill  . acetaminophen (TYLENOL) 325 MG tablet Take 325 mg by mouth every 6 (six) hours as needed. For pain      . albuterol (PROVENTIL HFA;VENTOLIN HFA) 108 (90 BASE) MCG/ACT inhaler Inhale 2 puffs into the lungs every 6 (six) hours as needed. For wheezing      . omeprazole (PRILOSEC) 20 MG capsule Take 1 capsule (20 mg total) by mouth daily.  30  capsule  1  . Prenatal Vit-Min-FA-Fish Oil ([employer] PRENATAL GUMMY PO) Take 2 each by mouth daily.       . diphenhydrAMINE (BENADRYL) 25 MG tablet Take 1 tablet (25 mg total) by mouth every 6 (six) hours as needed for itching (May take 50 mg at night if needed for severe itching).  30 tablet  0  . [DISCONTINUED] NIFEdipine (PROCARDIA-XL/ADALAT-CC/NIFEDICAL-XL) 30 MG 24 hr tablet Take 1 tablet (30 mg total) by mouth daily as needed. For sensation of contractions  15 tablet  0    ROS: Denies fever, chills, vision changes, contractions, leakage of fluid or vaginal bleeding. Good fetal movement. New moderate frontal HA a few days ago. None now.  Physical Exam  Blood pressure 120/76, pulse 88, temperature 98 F (36.7 C), temperature source Oral, resp. rate 20, height 5\' 7"  (1.702 m), weight 83.008 kg (183 lb), last menstrual period 07/19/2011. GENERAL: Well-developed, well-nourished female in no acute distress.  HEENT: normocephalic HEART: normal rate RESP: normal effort ABDOMEN: Soft, mild epigastric tenderness.  No hernia palpated w/ valsalva. Gravid appropriate for gestational age. EXTREMITIES: Nontender, no edema NEURO: alert and oriented SPECULUM EXAM: Deferred  FHT:  Baseline 130 , moderate variability, accelerations present, no decelerations Contractions: mild, irreg   Labs: Results for orders placed during the hospital encounter of 04/04/12 (from the past 24 hour(s))  COMPREHENSIVE METABOLIC PANEL     Status: Abnormal   Collection Time    04/04/12 10:12 PM      Result Value Range   Sodium 134 (*) 135 - 145 mEq/L   Potassium 3.8  3.5 - 5.1 mEq/L   Chloride 101  96 - 112 mEq/L   CO2 24  19 - 32 mEq/L   Glucose, Bld 96  70 - 99 mg/dL   BUN 7  6 - 23 mg/dL   Creatinine, Ser 4.09  0.50 - 1.10 mg/dL   Calcium 8.7  8.4 - 81.1 mg/dL   Total Protein 6.5  6.0 - 8.3 g/dL   Albumin 2.5 (*) 3.5 - 5.2 g/dL   AST 26  0 - 37 U/L   ALT 9  0 - 35 U/L   Alkaline Phosphatase 172 (*) 39 - 117 U/L    Total Bilirubin 0.2 (*) 0.3 - 1.2 mg/dL   GFR calc non Af Amer >90  >90 mL/min   GFR calc Af Amer >90  >90 mL/min  CBC     Status: Abnormal   Collection Time    04/04/12 10:12 PM      Result Value Range   WBC 8.3  4.0 - 10.5 K/uL   RBC 3.49 (*) 3.87 - 5.11 MIL/uL   Hemoglobin 10.1 (*) 12.0 - 15.0 g/dL   HCT 91.4 (*) 78.2 - 95.6 %   MCV 87.7  78.0 - 100.0 fL   MCH 28.9  26.0 - 34.0 pg   MCHC 33.0  30.0 - 36.0 g/dL   RDW 21.3  08.6 - 57.8 %   Platelets 158  150 - 400 K/uL    Imaging:  No results found. MAU Course: CMET, CBC, GI cocktail. 0008: No improvement in pain w/ GI cocktail. Pt reassured that lab work normal. Suspect pain due to diastasis rectus. Requesting pain meds/ Will give Vicodin.  0100: Pain improved significantly.  Assessment: 1. Diastasis recti     Plan: Discharge home Labor precautions and fetal kick counts. Comfort measures.     Follow-up Information   Follow up with [facility] On 04/07/2012.   Contact information:   [address] [place] [address] [phone]      Follow up with [facility] [place] [facility]. (As needed if symptoms worsen)    Contact information:   [address] [place] [address] [phone]       Medication List    STOP taking these medications       NIFEdipine 30 MG 24 hr tablet  Commonly known as:  PROCARDIA-XL/ADALAT-CC/NIFEDICAL-XL      TAKE these medications       acetaminophen 325 MG tablet  Commonly known as:  TYLENOL  Take 325 mg by mouth every 6 (six) hours as needed. For pain     albuterol 108 (90 BASE) MCG/ACT inhaler  Commonly known as:  PROVENTIL HFA;VENTOLIN HFA  Inhale 2 puffs into the lungs every 6 (six) hours as needed. For wheezing     [employer] PRENATAL GUMMY PO  Take 2 each by mouth daily.     diphenhydrAMINE 25 MG tablet  Commonly known as:  BENADRYL  Take 1 tablet (25 mg total) by mouth every 6 (six) hours as needed for itching (  May  take 50 mg at night if needed for severe itching).     HYDROcodone-acetaminophen 5-325 MG per tablet  Commonly known as:  NORCO/VICODIN  Take 1 tablet by mouth every 6 (six) hours as needed for pain.     omeprazole 20 MG capsule  Commonly known as:  PRILOSEC  Take 1 capsule (20 mg total) by mouth daily.        [place], CNM 04/05/2012 1:01 AM

## 2012-04-05 DIAGNOSIS — M6200 Separation of muscle (nontraumatic), unspecified site: Secondary | ICD-10-CM

## 2012-04-05 LAB — CULTURE, BETA STREP (GROUP B ONLY)

## 2012-04-05 MED ORDER — HYDROCODONE-ACETAMINOPHEN 5-325 MG PO TABS
2.0000 | ORAL_TABLET | Freq: Once | ORAL | Status: AC
  Administered 2012-04-05: 2 via ORAL
  Filled 2012-04-05: qty 2

## 2012-04-05 MED ORDER — HYDROCODONE-ACETAMINOPHEN 5-325 MG PO TABS: 1.0000 | ORAL_TABLET | Freq: Four times a day (QID) | ORAL | Status: DC | PRN

## 2012-04-06 ENCOUNTER — Encounter: Payer: Self-pay | Admitting: *Deleted

## 2012-04-06 LAB — OB RESULTS CONSOLE GBS: GBS: POSITIVE

## 2012-04-07 ENCOUNTER — Ambulatory Visit (INDEPENDENT_AMBULATORY_CARE_PROVIDER_SITE_OTHER): Payer: Medicaid Other | Admitting: Obstetrics and Gynecology

## 2012-04-07 ENCOUNTER — Other Ambulatory Visit: Payer: Self-pay | Admitting: Obstetrics and Gynecology

## 2012-04-07 VITALS — BP 129/88 | Temp 97.4°F | Wt 181.3 lb

## 2012-04-07 DIAGNOSIS — O0930 Supervision of pregnancy with insufficient antenatal care, unspecified trimester: Secondary | ICD-10-CM

## 2012-04-07 DIAGNOSIS — J45909 Unspecified asthma, uncomplicated: Secondary | ICD-10-CM

## 2012-04-07 DIAGNOSIS — O99519 Diseases of the respiratory system complicating pregnancy, unspecified trimester: Secondary | ICD-10-CM

## 2012-04-07 LAB — POCT URINALYSIS DIP (DEVICE)
Bilirubin Urine: NEGATIVE
Glucose, UA: NEGATIVE mg/dL
Ketones, ur: NEGATIVE mg/dL
Specific Gravity, Urine: 1.025 (ref 1.005–1.030)
Urobilinogen, UA: 0.2 mg/dL (ref 0.0–1.0)

## 2012-04-07 NOTE — Progress Notes (Signed)
Plulse- 98

## 2012-04-07 NOTE — Progress Notes (Signed)
Contractions more frequent and stronger than usual. SVE: post soft, 2/60/-2Tr LE. No UTI sx. GBS carriage discussed> tx in labor. Uses inhaler 2-3 x/wk. No wheezing or SOB. Plans Depo.

## 2012-04-07 NOTE — Patient Instructions (Addendum)
Pain Relief During Labor and Delivery All women are different when it comes to having pain. The amount of pain that a woman experiences during labor and delivery depends on their pain tolerance, contraction strength and the baby's size and the position.  There are many ways to prepare and deal with the pain. These ways include:  Taking prenatal classes to learn about labor and delivery. The more informed you are, the less anxious and afraid you may be, which can help lessen the pain.  Taking medication or anesthetics during labor and delivery.  Women preferring natural childbirth learn breathing and relaxation techniques and how to relax between contractions to control their pain. They may take a shower or bath, have their partner massage or place an ice pack on their back, or may just want to change positions during labor. METHODS AVAILABLE TO CONTROL PAIN:  Medications may be given by injection into the muscles or vein (intravenously) to ease the pain. These medications can be given along with another medication (barbiturates) to relax you. These medications keep you awake. There can be minor side effects such as nausea, trouble concentrating, becoming sleepy and lowering the heart rate of the baby. However, the dose given will not seriously affect the baby.  Paracervical block is given during labor by injecting numbing medication into the right and left sides of the cervix and vagina. This is found to relieve most of the labor pains. It may have to be given more than once.  A local anesthetic may be injected under the skin, on the outside of the vagina, in the perineal area to perform a small cut (episiotomy). This cut is done to prevent tearing of the vagina at the time of delivery.  Pudendal anesthesia is an injection given deep through the perineum into the area to the pudendal nerves. This numbs the outside of the vagina and perineum at the time of delivery.  Epidural anesthesia is an  injection of numbing medication into the area of the lower back and spinal column. This anesthetic is injected on the outside covering of the spinal cord. An epidural numbs the lower part of the body. It can be given continuously in small doses through a tube for prolonged anesthesia. It can also be used to do a Cesarean section. You are able to move your legs, but not allowed to walk. Epidural is a very common and popular form of anesthesia during labor and delivery. It should only be given by a trained and experienced anesthesiologist or anesthetist. Side effects may occur, such as:  Headache.  Drop in blood pressure.  Backache.  Dizziness.  Difficulty breathing if it affects your chest muscles.  Shivering.  Slowing down the contractions.  Require forceps or vacuum extraction delivery.  Higher incidence of Cesarean Section.  Rarely it can cause a convulsion.  Spinal block anesthesia is an injection of numbing medication into the lower back and spinal column area. However, this anesthetic is injected on the inside of the covering of the spinal cord. Spinal block anesthesia is only given once. Therefore, it is best to be given just before delivering the baby. It can also be used for Cesarean sections. The side effects are the same as the epidural side effects. It should only be given by a trained anesthesiologist or anesthetist. Whether a person takes medication or not during labor and delivery will depend on the needs of each individual and situation. Do not be afraid to ask for pain medication if you need  it. There is no reason to be ashamed, embarrassed or feel that you failed yourself if you take medication. You should discuss how you and your caregiver plan to control your pain during your prenatal visits. It may also be a good idea to talk to an anesthesiologist before your due date. Document Released: 05/22/2008 Document Revised: 04/28/2011 Document Reviewed: 05/22/2008 [facility]  Patient Information 2013 [place], [place].

## 2012-04-12 ENCOUNTER — Inpatient Hospital Stay (HOSPITAL_COMMUNITY)
Admission: AD | Admit: 2012-04-12 | Discharge: 2012-04-13 | Disposition: A | Discharge status: Home / Self Care | Payer: Medicaid Other | Source: Ambulatory Visit | Attending: Obstetrics & Gynecology | Admitting: Obstetrics & Gynecology

## 2012-04-12 ENCOUNTER — Encounter (HOSPITAL_COMMUNITY): Payer: Self-pay | Admitting: *Deleted

## 2012-04-12 DIAGNOSIS — Z0371 Encounter for suspected problem with amniotic cavity and membrane ruled out: Secondary | ICD-10-CM

## 2012-04-12 DIAGNOSIS — O479 False labor, unspecified: Secondary | ICD-10-CM | POA: Insufficient documentation

## 2012-04-12 NOTE — MAU Note (Signed)
Pt states she has been leaking a clear fluid around 2000 and is having contractions

## 2012-04-13 DIAGNOSIS — O479 False labor, unspecified: Secondary | ICD-10-CM

## 2012-04-13 LAB — POCT FERN TEST: POCT Fern Test: NEGATIVE

## 2012-04-13 NOTE — MAU Provider Note (Signed)
History     CSN: [id number]  Arrival date and time: 04/12/12 2258   First Provider Initiated Contact with Patient 04/12/12 2352      Chief Complaint  Patient presents with  . Labor Eval   HPI 22 y/o G2P1001 here with leaking fluid and contractions. She felt like she had a gush of fluid while she was walking this evening around 8pm. It soaked a panty liner and seems to come out more while she walks. She's been having contractions that have been strengthening (now up to 8/10) and becoming more frequent for three days. She denies vaginal bleeding, discharge, and states that her baby is moving normally. She denies fevers, chills, sweats, dyspnea, chest pain, constipation, dysuria, vaginal irritation, and swelling.   OB History   Grav Para Term Preterm Abortions TAB SAB Ect Mult Living   2 1 1       1       Past Medical History  Diagnosis Date  . Asthma   . Anxiety   . GERD (gastroesophageal reflux disease)     Past Surgical History  Procedure Laterality Date  . Vaginal delivery      X 1, no complications    Family History  Problem Relation Age of Onset  . Other Neg Hx   . Diabetes Mother   . Heart disease Mother   . Hypertension Mother   . Kidney disease Mother   . Arthritis Maternal Grandmother   . Asthma Maternal Grandmother   . Diabetes Maternal Grandmother   . Heart disease Maternal Grandmother   . Hypertension Maternal Grandmother   . Stroke Maternal Grandmother     History  Substance Use Topics  . Smoking status: Never Smoker   . Smokeless tobacco: Never Used  . Alcohol Use: No    Allergies: No Known Allergies  Prescriptions prior to admission  Medication Sig Dispense Refill  . acetaminophen (TYLENOL) 325 MG tablet Take 325 mg by mouth every 6 (six) hours as needed. For pain      . albuterol (PROVENTIL HFA;VENTOLIN HFA) 108 (90 BASE) MCG/ACT inhaler Inhale 2 puffs into the lungs every 6 (six) hours as needed. For wheezing      . Prenatal  Vit-Min-FA-Fish Oil ([employer] PRENATAL GUMMY PO) Take 2 each by mouth daily.       . diphenhydrAMINE (BENADRYL) 25 MG tablet Take 1 tablet (25 mg total) by mouth every 6 (six) hours as needed for itching (May take 50 mg at night if needed for severe itching).  30 tablet  0  . HYDROcodone-acetaminophen (NORCO/VICODIN) 5-325 MG per tablet Take 1 tablet by mouth every 6 (six) hours as needed for pain.  15 tablet  0  . omeprazole (PRILOSEC) 20 MG capsule Take 1 capsule (20 mg total) by mouth daily.  30 capsule  1    ROS per HPI Physical Exam   Blood pressure 130/75, pulse 91, temperature 98.2 F (36.8 C), temperature source Oral, resp. rate 20, height 5\' 7"  (1.702 m), weight 85.276 kg (188 lb), last menstrual period 07/19/2011, SpO2 100.00%.  Physical Exam Gen: NAD, alert, cooperative with exam HEENT: NCAT, EOMI, PERRL CV: RRR, good S1/S2, no murmur Resp: CTABL, no wheezes, non-labored Abd: Soft, pregnant abdomen, no tenderness to palpation Ext: No edema, warm Neuro: Alert and oriented, No gross deficits GU: spec exam with thick white dc, no blood or pooling  Cervical exam- 2 cm, 70% effacement, -2 station   MAU Course  Procedures  Vaginal fluid negative for  ferning   Assessment and Plan  22 y/o G2P1001 here with contractions and LOF - No pooling seen and negative for ferning - No significant cervical change form prior - Reviewed signs of labor, f/u in clinic  Kevin Fenton 04/13/2012, 12:11 AM   .I have seen the patient with the resident/student and agree with the above.  Tawnya Crook

## 2012-04-14 ENCOUNTER — Ambulatory Visit (INDEPENDENT_AMBULATORY_CARE_PROVIDER_SITE_OTHER): Payer: Medicaid Other | Admitting: Advanced Practice Midwife

## 2012-04-14 ENCOUNTER — Encounter: Payer: Self-pay | Admitting: Advanced Practice Midwife

## 2012-04-14 ENCOUNTER — Other Ambulatory Visit: Payer: Self-pay | Admitting: Advanced Practice Midwife

## 2012-04-14 VITALS — BP 124/76 | Temp 98.0°F | Wt 178.5 lb

## 2012-04-14 DIAGNOSIS — O0933 Supervision of pregnancy with insufficient antenatal care, third trimester: Secondary | ICD-10-CM

## 2012-04-14 DIAGNOSIS — O0930 Supervision of pregnancy with insufficient antenatal care, unspecified trimester: Secondary | ICD-10-CM

## 2012-04-14 LAB — POCT URINALYSIS DIP (DEVICE)
Bilirubin Urine: NEGATIVE
Glucose, UA: NEGATIVE mg/dL
Specific Gravity, Urine: >=1.03 (ref 1.005–1.030)
Urobilinogen, UA: 0.2 mg/dL (ref 0.0–1.0)

## 2012-04-14 NOTE — Progress Notes (Signed)
Pulse: 92

## 2012-04-14 NOTE — Progress Notes (Signed)
Seen for labor check 2 days ago. Cervix was 2-3.  Wants membrane sweeping, done today.  Cervix very posterior, but 3cm and very soft.

## 2012-04-14 NOTE — Patient Instructions (Addendum)
Normal Labor and Delivery  Your caregiver must first be sure you are in labor. Signs of labor include:  · You may pass what is called "the mucus plug" before labor begins. This is a small amount of blood stained mucus.  · Regular uterine contractions.  · The time between contractions get closer together.  · The discomfort and pain gradually gets more intense.  · Pains are mostly located in the back.  · Pains get worse when walking.  · The cervix (the opening of the uterus becomes thinner (begins to efface) and opens up (dilates).  Once you are in labor and admitted into the hospital or care center, your caregiver will do the following:  · A complete physical examination.  · Check your vital signs (blood pressure, pulse, temperature and the fetal heart rate).  · Do a vaginal examination (using a sterile glove and lubricant) to determine:  · The position (presentation) of the baby (head [vertex] or buttock first).  · The level (station) of the baby's head in the birth canal.  · The effacement and dilatation of the cervix.  · You may have your pubic hair shaved and be given an enema depending on your caregiver and the circumstance.  · An electronic monitor is usually placed on your abdomen. The monitor follows the length and intensity of the contractions, as well as the baby's heart rate.  · Usually, your caregiver will insert an IV in your arm with a bottle of sugar water. This is done as a precaution so that medications can be given to you quickly during labor or delivery.  NORMAL LABOR AND DELIVERY IS DIVIDED UP INTO 3 STAGES:  First Stage  This is when regular contractions begin and the cervix begins to efface and dilate. This stage can last from 3 to 15 hours. The end of the first stage is when the cervix is 100% effaced and 10 centimeters dilated. Pain medications may be given by   · Injection (morphine, demerol, etc.)  · Regional anesthesia (spinal, caudal or epidural, anesthetics given in different locations of  the spine). Paracervical pain medication may be given, which is an injection of and anesthetic on each side of the cervix.  A pregnant woman may request to have "Natural Childbirth" which is not to have any medications or anesthesia during her labor and delivery.  Second Stage  This is when the baby comes down through the birth canal (vagina) and is born. This can take 1 to 4 hours. As the baby's head comes down through the birth canal, you may feel like you are going to have a bowel movement. You will get the urge to bear down and push until the baby is delivered. As the baby's head is being delivered, the caregiver will decide if an episiotomy (a cut in the perineum and vagina area) is needed to prevent tearing of the tissue in this area. The episiotomy is sewn up after the delivery of the baby and placenta. Sometimes a mask with nitrous oxide is given for the mother to breath during the delivery of the baby to help if there is too much pain. The end of Stage 2 is when the baby is fully delivered. Then when the umbilical cord stops pulsating it is clamped and cut.  Third Stage  The third stage begins after the baby is completely delivered and ends after the placenta (afterbirth) is delivered. This usually takes 5 to 30 minutes. After the placenta is delivered, a medication   is given either by intravenous or injection to help contract the uterus and prevent bleeding. The third stage is not painful and pain medication is usually not necessary. If an episiotomy was done, it is repaired at this time.  After the delivery, the mother is watched and monitored closely for 1 to 2 hours to make sure there is no postpartum bleeding (hemorrhage). If there is a lot of bleeding, medication is given to contract the uterus and stop the bleeding.  Document Released: 11/13/2007 Document Revised: 04/28/2011 Document Reviewed: 11/13/2007  Exit[employer]® Patient Information ©2013 [employer].

## 2012-04-16 ENCOUNTER — Encounter (HOSPITAL_COMMUNITY): Payer: Self-pay | Admitting: Anesthesiology

## 2012-04-16 ENCOUNTER — Inpatient Hospital Stay (HOSPITAL_COMMUNITY): Payer: Medicaid Other | Admitting: Anesthesiology

## 2012-04-16 ENCOUNTER — Telehealth (HOSPITAL_COMMUNITY): Payer: Self-pay | Admitting: *Deleted

## 2012-04-16 ENCOUNTER — Encounter (HOSPITAL_COMMUNITY): Payer: Self-pay | Admitting: Family

## 2012-04-16 ENCOUNTER — Inpatient Hospital Stay (HOSPITAL_COMMUNITY)
Admission: AD | Admit: 2012-04-16 | Discharge: 2012-04-18 | DRG: 775 | Disposition: A | Discharge status: Home / Self Care | Payer: Medicaid Other | Source: Ambulatory Visit | Attending: Obstetrics and Gynecology | Admitting: Obstetrics and Gynecology

## 2012-04-16 ENCOUNTER — Encounter (HOSPITAL_COMMUNITY): Payer: Self-pay | Admitting: *Deleted

## 2012-04-16 DIAGNOSIS — O4700 False labor before 37 completed weeks of gestation, unspecified trimester: Secondary | ICD-10-CM

## 2012-04-16 DIAGNOSIS — Z22330 Carrier of Group B streptococcus: Secondary | ICD-10-CM

## 2012-04-16 DIAGNOSIS — O9989 Other specified diseases and conditions complicating pregnancy, childbirth and the puerperium: Secondary | ICD-10-CM

## 2012-04-16 DIAGNOSIS — O99892 Other specified diseases and conditions complicating childbirth: Principal | ICD-10-CM | POA: Diagnosis present

## 2012-04-16 DIAGNOSIS — A491 Streptococcal infection, unspecified site: Secondary | ICD-10-CM

## 2012-04-16 DIAGNOSIS — J45909 Unspecified asthma, uncomplicated: Secondary | ICD-10-CM

## 2012-04-16 DIAGNOSIS — IMO0001 Reserved for inherently not codable concepts without codable children: Secondary | ICD-10-CM

## 2012-04-16 LAB — RPR: RPR Ser Ql: NONREACTIVE

## 2012-04-16 LAB — CBC
Hemoglobin: 12.2 g/dL (ref 12.0–15.0)
MCH: 28.1 pg (ref 26.0–34.0)
MCHC: 32.2 g/dL (ref 30.0–36.0)
MCV: 87.3 fL (ref 78.0–100.0)
Platelets: 184 10*3/uL (ref 150–400)
RBC: 4.34 MIL/uL (ref 3.87–5.11)

## 2012-04-16 MED ORDER — TETANUS-DIPHTH-ACELL PERTUSSIS 5-2.5-18.5 LF-MCG/0.5 IM SUSP: 0.5000 mL | Freq: Once | INTRAMUSCULAR | Status: DC

## 2012-04-16 MED ORDER — SIMETHICONE 80 MG PO CHEW: 80.0000 mg | CHEWABLE_TABLET | ORAL | Status: DC | PRN

## 2012-04-16 MED ORDER — OXYTOCIN BOLUS FROM INFUSION: 500.0000 mL | INTRAVENOUS | Status: DC

## 2012-04-16 MED ORDER — OXYCODONE-ACETAMINOPHEN 5-325 MG PO TABS
1.0000 | ORAL_TABLET | ORAL | Status: DC | PRN
  Administered 2012-04-17 – 2012-04-18 (×3): 1 via ORAL
  Filled 2012-04-16 (×3): qty 1

## 2012-04-16 MED ORDER — SENNOSIDES-DOCUSATE SODIUM 8.6-50 MG PO TABS
2.0000 | ORAL_TABLET | Freq: Every day | ORAL | Status: DC
  Administered 2012-04-17 (×2): 2 via ORAL

## 2012-04-16 MED ORDER — LACTATED RINGERS IV SOLN: 500.0000 mL | Freq: Once | INTRAVENOUS | Status: DC

## 2012-04-16 MED ORDER — ONDANSETRON HCL 4 MG/2ML IJ SOLN: 4.0000 mg | INTRAMUSCULAR | Status: DC | PRN

## 2012-04-16 MED ORDER — IBUPROFEN 600 MG PO TABS
600.0000 mg | ORAL_TABLET | Freq: Four times a day (QID) | ORAL | Status: DC
  Administered 2012-04-17 – 2012-04-18 (×7): 600 mg via ORAL
  Filled 2012-04-16 (×7): qty 1

## 2012-04-16 MED ORDER — SODIUM CHLORIDE 0.9 % IV SOLN
2.0000 g | Freq: Four times a day (QID) | INTRAVENOUS | Status: DC
  Administered 2012-04-16: 2 g via INTRAVENOUS
  Filled 2012-04-16 (×3): qty 2000

## 2012-04-16 MED ORDER — FLEET ENEMA 7-19 GM/118ML RE ENEM: 1.0000 | ENEMA | RECTAL | Status: DC | PRN

## 2012-04-16 MED ORDER — FENTANYL 2.5 MCG/ML BUPIVACAINE 1/10 % EPIDURAL INFUSION (WH - ANES)
14.0000 mL/h | INTRAMUSCULAR | Status: DC
  Filled 2012-04-16: qty 125

## 2012-04-16 MED ORDER — EPHEDRINE 5 MG/ML INJ: 10.0000 mg | INTRAVENOUS | Status: DC | PRN

## 2012-04-16 MED ORDER — OXYCODONE-ACETAMINOPHEN 5-325 MG PO TABS: 1.0000 | ORAL_TABLET | ORAL | Status: DC | PRN

## 2012-04-16 MED ORDER — DIBUCAINE 1 % RE OINT
1.0000 "application " | TOPICAL_OINTMENT | RECTAL | Status: DC | PRN
  Administered 2012-04-17: 1 via RECTAL
  Filled 2012-04-16: qty 28

## 2012-04-16 MED ORDER — DIPHENHYDRAMINE HCL 25 MG PO CAPS: 25.0000 mg | ORAL_CAPSULE | Freq: Four times a day (QID) | ORAL | Status: DC | PRN

## 2012-04-16 MED ORDER — ACETAMINOPHEN 325 MG PO TABS: 650.0000 mg | ORAL_TABLET | ORAL | Status: DC | PRN

## 2012-04-16 MED ORDER — PHENYLEPHRINE 40 MCG/ML (10ML) SYRINGE FOR IV PUSH (FOR BLOOD PRESSURE SUPPORT)
80.0000 ug | PREFILLED_SYRINGE | INTRAVENOUS | Status: DC | PRN
  Filled 2012-04-16: qty 5

## 2012-04-16 MED ORDER — IBUPROFEN 600 MG PO TABS: 600.0000 mg | ORAL_TABLET | Freq: Four times a day (QID) | ORAL | Status: DC | PRN

## 2012-04-16 MED ORDER — EPHEDRINE 5 MG/ML INJ
10.0000 mg | INTRAVENOUS | Status: DC | PRN
  Filled 2012-04-16: qty 4

## 2012-04-16 MED ORDER — PRENATAL MULTIVITAMIN CH
1.0000 | ORAL_TABLET | Freq: Every day | ORAL | Status: DC
  Administered 2012-04-17: 1 via ORAL
  Filled 2012-04-16 (×2): qty 1

## 2012-04-16 MED ORDER — LACTATED RINGERS IV SOLN
INTRAVENOUS | Status: DC
  Administered 2012-04-16: 20:00:00 via INTRAVENOUS
  Administered 2012-04-16: 125 mL via INTRAVENOUS

## 2012-04-16 MED ORDER — LACTATED RINGERS IV SOLN: 500.0000 mL | INTRAVENOUS | Status: DC | PRN

## 2012-04-16 MED ORDER — ONDANSETRON HCL 4 MG PO TABS: 4.0000 mg | ORAL_TABLET | ORAL | Status: DC | PRN

## 2012-04-16 MED ORDER — PHENYLEPHRINE 40 MCG/ML (10ML) SYRINGE FOR IV PUSH (FOR BLOOD PRESSURE SUPPORT): 80.0000 ug | PREFILLED_SYRINGE | INTRAVENOUS | Status: DC | PRN

## 2012-04-16 MED ORDER — LIDOCAINE HCL (PF) 1 % IJ SOLN
30.0000 mL | INTRAMUSCULAR | Status: AC | PRN
  Administered 2012-04-16 (×2): 5 mL via SUBCUTANEOUS

## 2012-04-16 MED ORDER — BENZOCAINE-MENTHOL 20-0.5 % EX AERO
1.0000 "application " | INHALATION_SPRAY | CUTANEOUS | Status: DC | PRN
  Administered 2012-04-17: 1 via TOPICAL
  Filled 2012-04-16: qty 56

## 2012-04-16 MED ORDER — ZOLPIDEM TARTRATE 5 MG PO TABS
5.0000 mg | ORAL_TABLET | Freq: Every evening | ORAL | Status: DC | PRN
  Filled 2012-04-16: qty 1

## 2012-04-16 MED ORDER — OXYTOCIN 40 UNITS IN LACTATED RINGERS INFUSION - SIMPLE MED
62.5000 mL/h | INTRAVENOUS | Status: DC
  Administered 2012-04-16: 62.5 mL/h via INTRAVENOUS
  Filled 2012-04-16: qty 1000

## 2012-04-16 MED ORDER — DIPHENHYDRAMINE HCL 50 MG/ML IJ SOLN: 12.5000 mg | INTRAMUSCULAR | Status: DC | PRN

## 2012-04-16 MED ORDER — CITRIC ACID-SODIUM CITRATE 334-500 MG/5ML PO SOLN: 30.0000 mL | ORAL | Status: DC | PRN

## 2012-04-16 MED ORDER — WITCH HAZEL-GLYCERIN EX PADS
1.0000 "application " | MEDICATED_PAD | CUTANEOUS | Status: DC | PRN
  Administered 2012-04-17: 1 via TOPICAL

## 2012-04-16 MED ORDER — FENTANYL 2.5 MCG/ML BUPIVACAINE 1/10 % EPIDURAL INFUSION (WH - ANES)
INTRAMUSCULAR | Status: DC | PRN
  Administered 2012-04-16: 14 mL/h via EPIDURAL

## 2012-04-16 MED ORDER — LANOLIN HYDROUS EX OINT: TOPICAL_OINTMENT | CUTANEOUS | Status: DC | PRN

## 2012-04-16 MED ORDER — ONDANSETRON HCL 4 MG/2ML IJ SOLN: 4.0000 mg | Freq: Four times a day (QID) | INTRAMUSCULAR | Status: DC | PRN

## 2012-04-16 NOTE — Telephone Encounter (Signed)
Preadmission screen  

## 2012-04-16 NOTE — Anesthesia Procedure Notes (Signed)
Epidural Patient location during procedure: OB Start time: 04/16/2012 5:37 PM  Staffing Anesthesiologist: Angus Seller., Harrell Gave. Performed by: anesthesiologist   Preanesthetic Checklist Completed: patient identified, site marked, surgical consent, pre-op evaluation, timeout performed, IV checked, risks and benefits discussed and monitors and equipment checked  Epidural Patient position: sitting Prep: site prepped and draped and DuraPrep Patient monitoring: continuous pulse ox and blood pressure Approach: midline Injection technique: LOR air and LOR saline  Needle:  Needle type: Tuohy  Needle gauge: 17 G Needle length: 9 cm and 9 Needle insertion depth: 5 cm cm Catheter type: closed end flexible Catheter size: 19 Gauge Catheter at skin depth: 10 cm Test dose: negative  Assessment Events: blood not aspirated, injection not painful, no injection resistance, negative IV test and no paresthesia  Additional Notes Patient identified.  Risk benefits discussed including failed block, incomplete pain control, headache, nerve damage, paralysis, blood pressure changes, nausea, vomiting, reactions to medication both toxic or allergic, and postpartum back pain.  Patient expressed understanding and wished to proceed.  All questions were answered.  Sterile technique used throughout procedure and epidural site dressed with sterile barrier dressing. No paresthesia or other complications noted.The patient did not experience any signs of intravascular injection such as tinnitus or metallic taste in mouth nor signs of intrathecal spread such as rapid motor block. Please see nursing notes for vital signs.

## 2012-04-16 NOTE — Anesthesia Preprocedure Evaluation (Signed)
Anesthesia Evaluation  Patient identified by MRN, date of birth, ID band Patient awake    Reviewed: Allergy & Precautions, H&P , Patient's Chart, lab work & pertinent test results  Airway Mallampati: II TM Distance: >3 FB Neck ROM: full    Dental no notable dental hx.    Pulmonary neg pulmonary ROS, asthma ,  breath sounds clear to auscultation  Pulmonary exam normal       Cardiovascular negative cardio ROS  Rhythm:regular Rate:Normal     Neuro/Psych negative neurological ROS  negative psych ROS   GI/Hepatic negative GI ROS, Neg liver ROS, GERD-  ,  Endo/Other  negative endocrine ROS  Renal/GU negative Renal ROS     Musculoskeletal   Abdominal   Peds  Hematology negative hematology ROS (+)   Anesthesia Other Findings Asthma     Anxiety        GERD (gastroesophageal reflux disease)    Reproductive/Obstetrics (+) Pregnancy                           Anesthesia Physical Anesthesia Plan  ASA: II  Anesthesia Plan: Epidural   Post-op Pain Management:    Induction:   Airway Management Planned:   Additional Equipment:   Intra-op Plan:   Post-operative Plan:   Informed Consent: I have reviewed the patients History and Physical, chart, labs and discussed the procedure including the risks, benefits and alternatives for the proposed anesthesia with the patient or authorized representative who has indicated his/her understanding and acceptance.     Plan Discussed with:   Anesthesia Plan Comments:         Anesthesia Quick Evaluation

## 2012-04-16 NOTE — MAU Note (Addendum)
Patient presents to MAU with c/o contractions every 5 minutes since last night; reports good fetal movement. Denies vaginal bleeding or LOF.

## 2012-04-16 NOTE — Progress Notes (Signed)
Elois Averitt is a 22 y.o. G2P1001 at [redacted]w[redacted]d by ultrasound admitted for active labor  Subjective: Doing well, comfortable with epidural.  Objective: BP 111/72  Pulse 83  Temp(Src) 97.8 F (36.6 C) (Oral)  Resp 18  Ht 5\' 7"  (1.702 m)  Wt 181 lb (82.101 kg)  BMI 28.34 kg/m2  SpO2 91%  LMP 07/19/2011      FHT:  FHR: 140 bpm, variability: moderate,  accelerations:  Present,  decelerations:  Absent UC:   regular, every 3 minutes SVE:   Dilation: 5.5 Effacement (%): 90 Station: -2 Exam by:: Artelia Laroche CNM  Labs: Lab Results  Component Value Date   WBC 10.6* 04/16/2012   HGB 12.2 04/16/2012   HCT 37.9 04/16/2012   MCV 87.3 04/16/2012   PLT 184 04/16/2012    Assessment / Plan: Spontaneous labor, progressing normally  Labor: Progressing normally Preeclampsia:  n/a Fetal Wellbeing:  Category I Pain Control:  Epidural I/D:  n/a Anticipated MOD:  NSVD  Jennalyn Cawley 04/16/2012, 6:48 PM

## 2012-04-16 NOTE — Progress Notes (Signed)
Pt transferred to bathroom using stedy. Pt voided qs. Peri care done. Gown changed. Pt transferred to wheelchair.

## 2012-04-16 NOTE — MAU Note (Signed)
Has been having cramping since membranes stripped.  Now every 5 min and feeling pressure on her butt.

## 2012-04-16 NOTE — H&P (Signed)
Nicholas Ossa is a 22 y.o. female presenting for contractions/labor check.  RN Note:  Patient presents to MAU with c/o contractions every 5 minutes since last night; reports good fetal movement. Denies vaginal bleeding or LOF.        Maternal Medical History:  Reason for admission: Contractions.  Nausea.  Contractions: Onset was 3-5 hours ago.   Frequency: regular.   Perceived severity is moderate.    Fetal activity: Perceived fetal activity is normal.   Last perceived fetal movement was within the past hour.    Prenatal complications: No bleeding, pre-eclampsia or preterm labor.   Prenatal Complications - Diabetes: none.    OB History   Grav Para Term Preterm Abortions TAB SAB Ect Mult Living   2 1 1       1      Past Medical History  Diagnosis Date  . Asthma   . Anxiety   . GERD (gastroesophageal reflux disease)   . Chlamydia    Past Surgical History  Procedure Laterality Date  . Vaginal delivery      X 1, no complications   Family History: family history includes Arthritis in her maternal grandmother; Asthma in her maternal grandmother; Diabetes in her maternal grandmother and mother; Heart disease in her maternal grandmother and mother; Hypertension in her maternal grandmother and mother; Kidney disease in her mother; and Stroke in her maternal grandmother.  There is no history of Other. Social History:  reports that she quit smoking about 7 months ago. She has never used smokeless tobacco. She reports that she does not drink alcohol or use illicit drugs.   Prenatal Transfer Tool  Maternal Diabetes: No Genetic Screening: Normal Maternal Ultrasounds/Referrals: Normal Fetal Ultrasounds or other Referrals:  None Maternal Substance Abuse:  No Significant Maternal Medications:  None Significant Maternal Lab Results:  None Other Comments:  None  Review of Systems  Constitutional: Negative for fever and chills.  Gastrointestinal: Positive for abdominal pain.  Negative for nausea, vomiting, diarrhea and constipation.  Genitourinary: Negative for dysuria.  Neurological: Negative for dizziness and headaches.    Dilation: 5 Effacement (%): 80 Station: -1 Exam by:: C. Millican, RN  Blood pressure 110/56, pulse 92, temperature 98.5 F (36.9 C), temperature source Oral, resp. rate 18, height 5\' 7"  (1.702 m), weight 181 lb (82.101 kg), last menstrual period 07/19/2011, SpO2 91.00%. Maternal Exam:  Uterine Assessment: Contraction strength is moderate.  Contraction frequency is regular.   Abdomen: Fetal presentation: vertex  Introitus: Normal vulva. Normal vagina.  Vagina is negative for discharge.  Ferning test: not done.  Nitrazine test: not done. Amniotic fluid character: not assessed.  Pelvis: adequate for delivery.   Cervix: Cervix evaluated by digital exam.     Fetal Exam Fetal Monitor Review: Mode: ultrasound.   Baseline rate: 140.  Variability: moderate (6-25 bpm).   Pattern: accelerations present and no decelerations.    Fetal State Assessment: Category I - tracings are normal.     Physical Exam  Constitutional: She is oriented to person, place, and time. She appears well-developed and well-nourished. No distress.  Cardiovascular: Normal rate.   Respiratory: Effort normal.  GI: Soft. There is no tenderness.  Genitourinary: Vagina normal and uterus normal. No vaginal discharge found.  Musculoskeletal: Normal range of motion.  Neurological: She is alert and oriented to person, place, and time.  Skin: Skin is warm and dry.  Psychiatric: She has a normal mood and affect.    Prenatal labs: ABO, Rh: --/--/O POS, O POS (  01/21 0120) Antibody: NEG (01/21 0120) Rubella: Immune (07/30 0000) RPR: NON REAC (01/16 1101)  HBsAg: Negative (07/30 0000)  HIV: NON REACTIVE (01/16 1101)  GBS: Positive (02/18 1823)   Assessment/Plan: A:  SIUP at  [redacted]w[redacted]d      Active Labor      GBS positive  P:  Admit to [employer] [employer]       Routine  orders        Wants epidural   [facility] 04/16/2012, 6:19 PM

## 2012-04-17 NOTE — Progress Notes (Signed)
Post Partum Day #1 Subjective: up ad lib, voiding and tolerating PO; mostly bottlefeeding here- will add breastfeeding at home; desires Depo for contraception  Objective: Blood pressure 111/67, pulse 77, temperature 98.2 F (36.8 C), temperature source Oral, resp. rate 18, height 5\' 7"  (1.702 m), weight 181 lb (82.101 kg), last menstrual period 07/19/2011, SpO2 91.00%, unknown if currently breastfeeding.  Physical Exam:  General: alert, cooperative and no distress Lochia: appropriate Uterine Fundus: firm DVT Evaluation: No evidence of DVT seen on physical exam.   Recent Labs  04/16/12 1552  HGB 12.2  HCT 37.9    Assessment/Plan: Plan for discharge tomorrow Educated re breastfeeding and rec to initiate soon   LOS: 1 day   SHAW, KIMBERLY 04/17/2012, 7:30 AM

## 2012-04-17 NOTE — Anesthesia Postprocedure Evaluation (Signed)
Anesthesia Post Note  Patient: Marissa Lucas  Procedure(s) Performed: * No procedures listed *  Anesthesia type: Epidural  Patient location: Mother/Baby  Post pain: Pain level controlled  Post assessment: Post-op Vital signs reviewed  Last Vitals:  Filed Vitals:   04/17/12 1244  BP: 112/82  Pulse: 96  Temp: 36.8 C  Resp: 20    Post vital signs: Reviewed  Level of consciousness:alert  Complications: No apparent anesthesia complications

## 2012-04-18 MED ORDER — IBUPROFEN 600 MG PO TABS: 600.0000 mg | ORAL_TABLET | Freq: Four times a day (QID) | ORAL | Status: DC

## 2012-04-18 NOTE — Discharge Summary (Signed)
Obstetric Discharge Summary Reason for Admission: onset of labor Pt pushed approx one hour and at 9:26 PM a viable female was delivered via Vaginal, Spontaneous Delivery (Presentation: ; Right Occiput Posterior).  APGAR: 8, 6 ; weight 7+6.  Infant to pt's abd, dried. Cord clamped and cut by CNM. Hospital cord sample collected. Placenta status: Intact, Spontaneous.  Cord: 3 vessels   Anesthesia: Epidural  Episiotomy: None Lacerations: None Est. Blood Loss (mL): 350  Mom and baby stable after delivery.  Prenatal Procedures: ultrasound Intrapartum Procedures: spontaneous vaginal delivery Postpartum Procedures: none Complications-Operative and Postpartum: none Hemoglobin  Date Value Range Status  04/16/2012 12.2  12.0 - 15.0 g/dL Final     HCT  Date Value Range Status  04/16/2012 37.9  36.0 - 46.0 % Final    Physical Exam:  General: alert, cooperative and no distress Lochia: appropriate, scant Uterine Fundus: firm, -2 Incision: N/A DVT Evaluation: No evidence of DVT seen on physical exam.  Discharge Diagnoses: Term Pregnancy-delivered  Discharge Information: Date: 04/18/2012 Activity: pelvic rest Diet: routine Medications: Ibuprofen Condition: stable Instructions: refer to practice specific booklet Discharge to: home Contraception: Depo Provera Pt plans F/U with Gyn clinic for Depo and PP visit  Newborn Data: Live born female  Birth Weight: 7 lb 6 oz (3345 g) APGAR: 8, 6  Bottle feeding Home with mother.  LEFTWICH-KIRBY, LISA 04/18/2012, 9:24 AM

## 2012-04-18 NOTE — Progress Notes (Signed)
CSW spoke with MOB again about social situation at MD request.  MOB expressed she has a 20 year old boy (elijah) who stays with MOB's father.  MOB reports she has support from her family (her grandmother was visiting and supportive when CSW was in room 3/1) however, housing support for herself is not being offered by family at this time. MOB did not want to discuss family dynamics or FOB with CSW at this time, however expressed there are no safety concerns with family or FOB. MOB reports Sister Bird[facility] Hopes' house only allows one child at a time to reside there and this is why her 20 year old is living with her father at this time.  MOB states once Sister Susan's house helps with housing she plans for her 20 year old to reside with her once again.  MOB reports Sister Susan's house provides any counseling support if needed and continued parenting education and emotional support.  Please reconsult CSW if any further needs arise.  No barriers to discharge at this time.  [phone]

## 2012-04-18 NOTE — Clinical Social Work Note (Signed)
CSW spoke with MOB about hx of anxiety and living situation.  MOB reports she has occasional anxiety, however no significant symptoms and does not feel she needs medication management or counseling resources.  CSW discussed living situation.  MOB lives at Sister [facility]'s house which offers lots of supports for MOB.  MOB reported they are going to help her financial's in finding employment and transitional housing.  She reports she has been living there about 3 months and can stay up to 24 months if needed.  MOB does not express any concern with supplies or support.  RN confirmed someone from [facility] house has been here to continue support with MOB.  No barriers to discharge at this time.  Please reconsult if further needs arise.   [phone] 

## 2012-04-20 NOTE — Progress Notes (Signed)
Post discharge chart review completed.  

## 2012-04-21 ENCOUNTER — Encounter: Payer: Self-pay | Admitting: Obstetrics and Gynecology

## 2012-04-21 ENCOUNTER — Encounter: Payer: Medicaid Other | Admitting: Advanced Practice Midwife

## 2012-04-21 NOTE — H&P (Signed)
Attestation of Attending Supervision of Advanced Practitioner (CNM/NP): Evaluation and management procedures were performed by the Advanced Practitioner under my supervision and collaboration.  I have reviewed the Advanced Practitioner's note and chart, and I agree with the management and plan.  Wendee Hata 04/21/2012 9:23 AM

## 2012-05-17 ENCOUNTER — Encounter: Payer: Self-pay | Admitting: Obstetrics and Gynecology

## 2012-05-17 ENCOUNTER — Ambulatory Visit (INDEPENDENT_AMBULATORY_CARE_PROVIDER_SITE_OTHER): Payer: Medicaid Other | Admitting: Obstetrics and Gynecology

## 2012-05-17 ENCOUNTER — Other Ambulatory Visit: Payer: Self-pay | Admitting: Obstetrics and Gynecology

## 2012-05-17 DIAGNOSIS — F17200 Nicotine dependence, unspecified, uncomplicated: Secondary | ICD-10-CM

## 2012-05-17 DIAGNOSIS — Z3049 Encounter for surveillance of other contraceptives: Secondary | ICD-10-CM

## 2012-05-17 DIAGNOSIS — N898 Other specified noninflammatory disorders of vagina: Secondary | ICD-10-CM

## 2012-05-17 LAB — POCT PREGNANCY, URINE: Preg Test, Ur: NEGATIVE

## 2012-05-17 MED ORDER — MEDROXYPROGESTERONE ACETATE 150 MG/ML IM SUSP
150.0000 mg | Freq: Once | INTRAMUSCULAR | Status: AC
  Administered 2012-05-17: 150 mg via INTRAMUSCULAR

## 2012-05-17 MED ORDER — MEDROXYPROGESTERONE ACETATE 150 MG/ML IM SUSP: 150.0000 mg | INTRAMUSCULAR | Status: DC

## 2012-05-17 NOTE — Addendum Note (Signed)
Addended by: Sherre Lain A on: 05/17/2012 03:11 PM   Modules accepted: Orders

## 2012-05-17 NOTE — Patient Instructions (Signed)
Place depot medroxyprogesterone acetate injection patient instructions here. Smoking Cessation Quitting smoking is important to your health and has many advantages. However, it is not always easy to quit since nicotine is a very addictive drug. Often times, people try 3 times or more before being able to quit. This document explains the best ways for you to prepare to quit smoking. Quitting takes hard work and a lot of effort, but you can do it. ADVANTAGES OF QUITTING SMOKING  You will live longer, feel better, and live better.  Your body will feel the impact of quitting smoking almost immediately.  Within 20 minutes, blood pressure decreases. Your pulse returns to its normal level.  After 8 hours, carbon monoxide levels in the blood return to normal. Your oxygen level increases.  After 24 hours, the chance of having a heart attack starts to decrease. Your breath, hair, and body stop smelling like smoke.  After 48 hours, damaged nerve endings begin to recover. Your sense of taste and smell improve.  After 72 hours, the body is virtually free of nicotine. Your bronchial tubes relax and breathing becomes easier.  After 2 to 12 weeks, lungs can hold more air. Exercise becomes easier and circulation improves.  The risk of having a heart attack, stroke, cancer, or lung disease is greatly reduced.  After 1 year, the risk of coronary heart disease is cut in half.  After 5 years, the risk of stroke falls to the same as a nonsmoker.  After 10 years, the risk of lung cancer is cut in half and the risk of other cancers decreases significantly.  After 15 years, the risk of coronary heart disease drops, usually to the level of a nonsmoker.  If you are pregnant, quitting smoking will improve your chances of having a healthy baby.  The people you live with, especially any children, will be healthier.  You will have extra money to spend on things other than cigarettes. QUESTIONS TO THINK ABOUT  BEFORE ATTEMPTING TO QUIT You may want to talk about your answers with your caregiver.  Why do you want to quit?  If you tried to quit in the past, what helped and what did not?  What will be the most difficult situations for you after you quit? How will you plan to handle them?  Who can help you through the tough times? Your family? Friends? A caregiver?  What pleasures do you get from smoking? What ways can you still get pleasure if you quit? Here are some questions to ask your caregiver:  How can you help me to be successful at quitting?  What medicine do you think would be best for me and how should I take it?  What should I do if I need more help?  What is smoking withdrawal like? How can I get information on withdrawal? GET READY  Set a quit date.  Change your environment by getting rid of all cigarettes, ashtrays, matches, and lighters in your home, car, or work. Do not let people smoke in your home.  Review your past attempts to quit. Think about what worked and what did not. GET SUPPORT AND ENCOURAGEMENT You have a better chance of being successful if you have help. You can get support in many ways.  Tell your family, friends, and co-workers that you are going to quit and need their support. Ask them not to smoke around you.  Get individual, group, or telephone counseling and support. Programs are available at [employer] and  health centers. Call your local health department for information about programs in your area.  Spiritual beliefs and practices may help some smokers quit.  Download a "quit meter" on your computer to keep track of quit statistics, such as how long you have gone without smoking, cigarettes not smoked, and money saved.  Get a self-help book about quitting smoking and staying off of tobacco. LEARN NEW SKILLS AND BEHAVIORS  Distract yourself from urges to smoke. Talk to someone, go for a walk, or occupy your time with a task.  Change your  normal routine. Take a different route to work. Drink tea instead of coffee. Eat breakfast in a different place.  Reduce your stress. Take a hot bath, exercise, or read a book.  Plan something enjoyable to do every day. Reward yourself for not smoking.  Explore interactive web-based programs that specialize in helping you quit. GET MEDICINE AND USE IT CORRECTLY Medicines can help you stop smoking and decrease the urge to smoke. Combining medicine with the above behavioral methods and support can greatly increase your chances of successfully quitting smoking.  Nicotine replacement therapy helps deliver nicotine to your body without the negative effects and risks of smoking. Nicotine replacement therapy includes nicotine gum, lozenges, inhalers, nasal sprays, and skin patches. Some may be available over-the-counter and others require a prescription.  Antidepressant medicine helps people abstain from smoking, but how this works is unknown. This medicine is available by prescription.  Nicotinic receptor partial agonist medicine simulates the effect of nicotine in your brain. This medicine is available by prescription. Ask your caregiver for advice about which medicines to use and how to use them based on your health history. Your caregiver will tell you what side effects to look out for if you choose to be on a medicine or therapy. Carefully read the information on the package. Do not use any other product containing nicotine while using a nicotine replacement product.  RELAPSE OR DIFFICULT SITUATIONS Most relapses occur within the first 3 months after quitting. Do not be discouraged if you start smoking again. Remember, most people try several times before finally quitting. You may have symptoms of withdrawal because your body is used to nicotine. You may crave cigarettes, be irritable, feel very hungry, cough often, get headaches, or have difficulty concentrating. The withdrawal symptoms are only  temporary. They are strongest when you first quit, but they will go away within 10 14 days. To reduce the chances of relapse, try to:  Avoid drinking alcohol. Drinking lowers your chances of successfully quitting.  Reduce the amount of caffeine you consume. Once you quit smoking, the amount of caffeine in your body increases and can give you symptoms, such as a rapid heartbeat, sweating, and anxiety.  Avoid smokers because they can make you want to smoke.  Do not let weight gain distract you. Many smokers will gain weight when they quit, usually less than 10 pounds. Eat a healthy diet and stay active. You can always lose the weight gained after you quit.  Find ways to improve your mood other than smoking. FOR MORE INFORMATION  www.smokefree.gov  Document Released: 01/28/2001 Document Revised: 08/05/2011 Document Reviewed: 05/15/2011 [facility] Patient Information 2013 [place], [place].

## 2012-05-17 NOTE — Progress Notes (Signed)
  Subjective:     Marissa Lucas is a 22 y.o. female G2P2002 who presents for a postpartum visit. She is 4 weeks postpartum following a spontaneous vaginal delivery. I have fully reviewed the prenatal and intrapartum course. The delivery was at 40 gestational weeks. Outcome: spontaneous vaginal delivery. Anesthesia: epidural. Postpartum course has been uncomplicated. Baby's course has been uncomplicated. Baby is feeding by bottle. Bleeding no bleeding. Bowel function is normal. Bladder function is normal. Patient is not sexually active. Contraception method is abstinence. Postpartum depression screening: negative. Pap neg in July 2013.  The following portions of the patient's history were reviewed and updated as appropriate: allergies, current medications, past family history, past medical history, past social history, past surgical history and problem list.  Review of Systems Pertinent items are noted in HPI.  Had vaginal pruritis last week  Objective:    There were no vitals taken for this visit.  General:  alert and no distress   Breasts:  inspection negative, no nipple discharge or bleeding, no masses or nodularity palpable  Lungs: clear to auscultation bilaterally  Heart:  regular rate and rhythm, S1, S2 normal, no murmur, click, rub or gallop  Abdomen: soft, non-tender; bowel sounds normal; no masses,  no organomegaly   Vulva:  normal sm amt white discharge at introitus and adherent to vaginal walls  Vagina: normal vagina  Cervix:  multiparous appearance and no cervical motion tenderness  Corpus: normal size, contour, position, consistency, mobility, non-tender  Adnexa:  no mass, fullness, tenderness  Rectal Exam: Not performed.        Assessment:     4 wks postpartum exam. Pap smear not done at today's visit.  Smoker  Plan:    1. Contraception: abstinence Start Depoprovera today 2. Wet prep sent 3. Follow up in: 3 months or as needed. Pap due in 4 months Smoking cessation  urged

## 2012-05-17 NOTE — Addendum Note (Signed)
Addended by: Sherre Lain A on: 05/17/2012 03:30 PM   Modules accepted: Orders

## 2012-05-18 LAB — WET PREP, GENITAL: Yeast Wet Prep HPF POC: NEGATIVE

## 2012-05-19 ENCOUNTER — Telehealth: Payer: Self-pay

## 2012-05-19 MED ORDER — METRONIDAZOLE 500 MG PO TABS: 500.0000 mg | ORAL_TABLET | Freq: Two times a day (BID) | ORAL | Status: DC

## 2012-05-19 MED ORDER — FLUCONAZOLE 150 MG PO TABS: 150.0000 mg | ORAL_TABLET | Freq: Once | ORAL | Status: DC

## 2012-05-19 NOTE — Telephone Encounter (Signed)
Message copied by Faythe Casa on Wed May 19, 2012  3:50 PM ------      Message from: POE, DEIRDRE C      Created: Wed May 19, 2012  9:27 AM       Falgyl 500 bid x5d ------

## 2012-05-19 NOTE — Telephone Encounter (Signed)
Called pt and and informed her of the results of BV and that an Rx has been sent to pharmacy in which I verified.  Pt also wanted something for yeast infection after antibiotics cause she stated that she is prone to it.  I informed pt that I would also e-prescribe diflucan as well. Pt stated understanding and did not have any further questions.

## 2012-06-07 ENCOUNTER — Inpatient Hospital Stay (HOSPITAL_COMMUNITY)
Admission: AD | Admit: 2012-06-07 | Discharge: 2012-06-07 | Discharge status: Against Medical Advice | Payer: Medicaid Other | Source: Ambulatory Visit | Attending: Obstetrics and Gynecology | Admitting: Obstetrics and Gynecology

## 2012-06-07 NOTE — MAU Note (Signed)
Pt not in lobby.  

## 2012-06-07 NOTE — MAU Note (Signed)
Pt not in lobby, did not notify staff/registration she was leaving.

## 2012-06-07 NOTE — MAU Note (Signed)
Pt not in lobby at 1713

## 2012-06-24 ENCOUNTER — Emergency Department (HOSPITAL_COMMUNITY)
Admission: EM | Admit: 2012-06-24 | Discharge: 2012-06-24 | Disposition: A | Discharge status: Home / Self Care | Payer: Medicaid Other

## 2012-06-24 NOTE — ED Notes (Signed)
Pt did not answer x 3 

## 2012-06-24 NOTE — ED Notes (Signed)
Pt did not answer.

## 2012-06-24 NOTE — ED Notes (Signed)
Pt called in main ED waiting room with no response

## 2012-07-27 ENCOUNTER — Encounter (HOSPITAL_COMMUNITY): Payer: Self-pay | Admitting: Nurse Practitioner

## 2012-07-27 ENCOUNTER — Emergency Department (HOSPITAL_COMMUNITY)
Admission: EM | Admit: 2012-07-27 | Discharge: 2012-07-27 | Disposition: A | Discharge status: Home / Self Care | Payer: Medicaid Other | Attending: Emergency Medicine | Admitting: Emergency Medicine

## 2012-07-27 DIAGNOSIS — Z8719 Personal history of other diseases of the digestive system: Secondary | ICD-10-CM | POA: Insufficient documentation

## 2012-07-27 DIAGNOSIS — R109 Unspecified abdominal pain: Secondary | ICD-10-CM | POA: Insufficient documentation

## 2012-07-27 DIAGNOSIS — J45909 Unspecified asthma, uncomplicated: Secondary | ICD-10-CM | POA: Insufficient documentation

## 2012-07-27 DIAGNOSIS — Z8619 Personal history of other infectious and parasitic diseases: Secondary | ICD-10-CM | POA: Insufficient documentation

## 2012-07-27 DIAGNOSIS — Z3202 Encounter for pregnancy test, result negative: Secondary | ICD-10-CM | POA: Insufficient documentation

## 2012-07-27 DIAGNOSIS — Z8659 Personal history of other mental and behavioral disorders: Secondary | ICD-10-CM | POA: Insufficient documentation

## 2012-07-27 DIAGNOSIS — F17200 Nicotine dependence, unspecified, uncomplicated: Secondary | ICD-10-CM | POA: Insufficient documentation

## 2012-07-27 DIAGNOSIS — R1013 Epigastric pain: Secondary | ICD-10-CM

## 2012-07-27 LAB — URINALYSIS, ROUTINE W REFLEX MICROSCOPIC
Bilirubin Urine: NEGATIVE
Glucose, UA: NEGATIVE mg/dL
Ketones, ur: NEGATIVE mg/dL
Nitrite: NEGATIVE
Specific Gravity, Urine: 1.018 (ref 1.005–1.030)
pH: 7 (ref 5.0–8.0)

## 2012-07-27 LAB — COMPREHENSIVE METABOLIC PANEL
ALT: 15 U/L (ref 0–35)
AST: 16 U/L (ref 0–37)
Albumin: 4 g/dL (ref 3.5–5.2)
Alkaline Phosphatase: 68 U/L (ref 39–117)
Chloride: 106 mEq/L (ref 96–112)
Potassium: 3.6 mEq/L (ref 3.5–5.1)
Sodium: 140 mEq/L (ref 135–145)
Total Bilirubin: 0.2 mg/dL — ABNORMAL LOW (ref 0.3–1.2)
Total Protein: 7.9 g/dL (ref 6.0–8.3)

## 2012-07-27 LAB — CBC WITH DIFFERENTIAL/PLATELET
Basophils Absolute: 0 10*3/uL (ref 0.0–0.1)
Basophils Relative: 1 % (ref 0–1)
Eosinophils Absolute: 0.2 10*3/uL (ref 0.0–0.7)
Hemoglobin: 12.5 g/dL (ref 12.0–15.0)
MCHC: 32.7 g/dL (ref 30.0–36.0)
Monocytes Relative: 7 % (ref 3–12)
Neutro Abs: 3.6 10*3/uL (ref 1.7–7.7)
Neutrophils Relative %: 53 % (ref 43–77)
Platelets: 188 10*3/uL (ref 150–400)
RDW: 13.1 % (ref 11.5–15.5)

## 2012-07-27 LAB — URINE MICROSCOPIC-ADD ON

## 2012-07-27 MED ORDER — SUCRALFATE 1 G PO TABS: 1.0000 g | ORAL_TABLET | Freq: Four times a day (QID) | ORAL | Status: DC

## 2012-07-27 MED ORDER — ACETAMINOPHEN 325 MG PO TABS
650.0000 mg | ORAL_TABLET | Freq: Once | ORAL | Status: AC
  Administered 2012-07-27: 650 mg via ORAL
  Filled 2012-07-27: qty 2

## 2012-07-27 MED ORDER — GI COCKTAIL ~~LOC~~
30.0000 mL | Freq: Once | ORAL | Status: AC
  Administered 2012-07-27: 30 mL via ORAL
  Filled 2012-07-27: qty 30

## 2012-07-27 MED ORDER — OMEPRAZOLE 20 MG PO CPDR: 20.0000 mg | DELAYED_RELEASE_CAPSULE | Freq: Every day | ORAL | Status: DC

## 2012-07-27 NOTE — ED Notes (Signed)
C/o upper abd pain for past weeks that increased in severity last night. Pain increased with movement and decreased with rest. Denies any n/v/bowel/bladders changes. Has been able to tolerate oral intake.

## 2012-07-27 NOTE — ED Notes (Signed)
Pt unable to obtain urine specimen at this time 

## 2012-07-27 NOTE — ED Provider Notes (Signed)
History     CSN: [id number]  Arrival date & time 07/27/12  1116   First MD Initiated Contact with Patient 07/27/12 1219      Chief Complaint  Patient presents with  . Abdominal Pain    (Consider location/radiation/quality/duration/timing/severity/associated sxs/prior treatment) HPI Comments: Patient with no past surgical history presents with complaint of 2 weeks of upper, middle abdominal pain that is described as dull and nonradiating. It is been worse over the past 2 days. It is not changed with food. Patient does not tried any treatments and states that nothing makes it better or worse. She denies any fever, nausea, vomiting, diarrhea, urinary symptoms. She denies chest pain or shortness of breath. She denies lower extremity swelling. Patient was recently pregnant and delivered 3 months ago. She denies heavy NSAID use or alcohol use. She denies history of heartburn. Onset of symptoms gradual. Course is gradually worsening.  Patient is a 22 y.o. female presenting with abdominal pain. The history is provided by the patient.  Abdominal Pain Associated symptoms include abdominal pain. Pertinent negatives include no chest pain, coughing, fever, headaches, myalgias, nausea, rash, sore throat or vomiting.    Past Medical History  Diagnosis Date  . Asthma   . Anxiety   . GERD (gastroesophageal reflux disease)   . Chlamydia     Past Surgical History  Procedure Laterality Date  . Vaginal delivery      X 1, no complications    Family History  Problem Relation Age of Onset  . Other Neg Hx   . Diabetes Mother   . Heart disease Mother   . Hypertension Mother   . Kidney disease Mother   . Arthritis Maternal Grandmother   . Asthma Maternal Grandmother   . Diabetes Maternal Grandmother   . Heart disease Maternal Grandmother   . Hypertension Maternal Grandmother   . Stroke Maternal Grandmother     History  Substance Use Topics  . Smoking status: Current Every Day Smoker -- 0.30  packs/day    Types: Cigarettes    Last Attempt to Quit: 09/14/2011  . Smokeless tobacco: Never Used  . Alcohol Use: No    OB History   Grav Para Term Preterm Abortions TAB SAB Ect Mult Living   2 2 2       2       Review of Systems  Constitutional: Negative for fever.  HENT: Negative for sore throat and rhinorrhea.   Eyes: Negative for redness.  Respiratory: Negative for cough.   Cardiovascular: Negative for chest pain.  Gastrointestinal: Positive for abdominal pain. Negative for nausea, vomiting and diarrhea.  Genitourinary: Negative for dysuria.  Musculoskeletal: Negative for myalgias.  Skin: Negative for rash.  Neurological: Negative for headaches.    Allergies  Review of patient's allergies indicates no known allergies.  Home Medications   Current Outpatient Rx  Name  Route  Sig  Dispense  Refill  . acetaminophen (TYLENOL) 325 MG tablet   Oral   Take 325 mg by mouth every 6 (six) hours as needed. For pain         . albuterol (PROVENTIL HFA;VENTOLIN HFA) 108 (90 BASE) MCG/ACT inhaler   Inhalation   Inhale 2 puffs into the lungs every 6 (six) hours as needed. For wheezing         . medroxyPROGESTERone (DEPO-PROVERA) 150 MG/ML injection   Intramuscular   Inject 1 mL (150 mg total) into the muscle every 3 (three) months.   1 mL   0  BP 120/81  Pulse 70  Temp(Src) 98.4 F (36.9 C) (Oral)  Resp 16  Ht 5\' 7"  (1.702 m)  Wt 145 lb (65.772 kg)  BMI 22.71 kg/m2  SpO2 100%  Physical Exam  Nursing note and vitals reviewed. Constitutional: She appears well-developed and well-nourished.  HENT:  Head: Normocephalic and atraumatic.  Eyes: Conjunctivae are normal. Right eye exhibits no discharge. Left eye exhibits no discharge.  Neck: Normal range of motion. Neck supple.  Cardiovascular: Normal rate, regular rhythm and normal heart sounds.   Pulmonary/Chest: Effort normal and breath sounds normal.  Abdominal: Soft. Bowel sounds are normal. There is  tenderness (Mild tenderness to deep palpation) in the right upper quadrant, epigastric area and left upper quadrant. There is no rigidity, no rebound, no guarding, no tenderness at McBurney's point and negative Murphy's sign.  Neurological: She is alert.  Skin: Skin is warm and dry.  Psychiatric: She has a normal mood and affect.    ED Course  Procedures (including critical care time)  Labs Reviewed  COMPREHENSIVE METABOLIC PANEL - Abnormal; Notable for the following:    Total Bilirubin 0.2 (*)    All other components within normal limits  URINALYSIS, ROUTINE W REFLEX MICROSCOPIC - Abnormal; Notable for the following:    Hgb urine dipstick TRACE (*)    All other components within normal limits  URINE MICROSCOPIC-ADD ON - Abnormal; Notable for the following:    Bacteria, UA FEW (*)    All other components within normal limits  CBC WITH DIFFERENTIAL  LIPASE, BLOOD  POCT PREGNANCY, URINE   No results found.   1. Epigastric pain     1:04 PM Patient seen and examined. Work-up initiated. Medications ordered.   Vital signs reviewed and are as follows: Filed Vitals:   07/27/12 1137  BP: 120/81  Pulse: 70  Temp: 98.4 F (36.9 C)  Resp: 16   Patient informed of al results. She is requesting d/c to home. Will start on PPI and carafate.   The patient was urged to return to the Emergency Department immediately with worsening of current symptoms, worsening abdominal pain, persistent vomiting, blood noted in stools, fever, or any other concerns. The patient verbalized understanding.     MDM  Epigastric pain, non-specific. Will treat empirically for upper GI etiology. Work-up and exam is reassuring here. Neg Murphy sign. Symptoms controlled. If tx fails, pt given GI referral for outpatient work-up. Appropriate return instructions given.         Renne Crigler, PA-C 07/27/12 1755

## 2012-07-28 NOTE — ED Provider Notes (Signed)
Medical screening examination/treatment/procedure(s) were performed by non-physician practitioner and as supervising physician I was immediately available for consultation/collaboration.   Laquiesha Piacente L Ajmal Kathan, MD 07/28/12 1555 

## 2012-08-23 ENCOUNTER — Telehealth: Payer: Self-pay | Admitting: *Deleted

## 2012-08-23 NOTE — Telephone Encounter (Signed)
Marissa Lucas called and left a message stating she is calling about her depoprovera shot. States she thinks her insurance is on hold, but wants to get shot which is already pastdue. Phone number is either [phone] or [phone]

## 2012-08-24 NOTE — Telephone Encounter (Signed)
Called pt and unable to leave message due to ringing and then just hanging up w/o VM.   

## 2012-08-25 NOTE — Telephone Encounter (Signed)
Called pt and unable to leave message due to ringing and then just hanging up w/o VM.

## 2012-08-30 ENCOUNTER — Emergency Department (HOSPITAL_COMMUNITY): Payer: Medicaid Other

## 2012-08-30 ENCOUNTER — Encounter (HOSPITAL_COMMUNITY): Payer: Self-pay

## 2012-08-30 ENCOUNTER — Emergency Department (HOSPITAL_COMMUNITY)
Admission: EM | Admit: 2012-08-30 | Discharge: 2012-08-31 | Disposition: A | Discharge status: Home / Self Care | Payer: Medicaid Other | Attending: Emergency Medicine | Admitting: Emergency Medicine

## 2012-08-30 DIAGNOSIS — E876 Hypokalemia: Secondary | ICD-10-CM | POA: Insufficient documentation

## 2012-08-30 DIAGNOSIS — Z8719 Personal history of other diseases of the digestive system: Secondary | ICD-10-CM | POA: Insufficient documentation

## 2012-08-30 DIAGNOSIS — J45901 Unspecified asthma with (acute) exacerbation: Secondary | ICD-10-CM | POA: Insufficient documentation

## 2012-08-30 DIAGNOSIS — Z8619 Personal history of other infectious and parasitic diseases: Secondary | ICD-10-CM | POA: Insufficient documentation

## 2012-08-30 DIAGNOSIS — Z8659 Personal history of other mental and behavioral disorders: Secondary | ICD-10-CM | POA: Insufficient documentation

## 2012-08-30 DIAGNOSIS — F17200 Nicotine dependence, unspecified, uncomplicated: Secondary | ICD-10-CM | POA: Insufficient documentation

## 2012-08-30 DIAGNOSIS — R0789 Other chest pain: Secondary | ICD-10-CM | POA: Insufficient documentation

## 2012-08-30 DIAGNOSIS — J4521 Mild intermittent asthma with (acute) exacerbation: Secondary | ICD-10-CM

## 2012-08-30 LAB — CBC
MCH: 28.5 pg (ref 26.0–34.0)
MCHC: 33.5 g/dL (ref 30.0–36.0)
Platelets: 178 10*3/uL (ref 150–400)
RBC: 4.25 MIL/uL (ref 3.87–5.11)

## 2012-08-30 MED ORDER — ALBUTEROL SULFATE (5 MG/ML) 0.5% IN NEBU
5.0000 mg | INHALATION_SOLUTION | Freq: Once | RESPIRATORY_TRACT | Status: AC
  Administered 2012-08-30: 5 mg via RESPIRATORY_TRACT
  Filled 2012-08-30: qty 0.5

## 2012-08-30 NOTE — ED Notes (Signed)
Pt complains of shortness of breath for two days, no relief with inhaler

## 2012-08-31 LAB — BASIC METABOLIC PANEL
Calcium: 9.3 mg/dL (ref 8.4–10.5)
GFR calc non Af Amer: >90 mL/min (ref >90)
Sodium: 137 mEq/L (ref 135–145)

## 2012-08-31 MED ORDER — POTASSIUM CHLORIDE CRYS ER 20 MEQ PO TBCR
40.0000 meq | EXTENDED_RELEASE_TABLET | Freq: Once | ORAL | Status: AC
  Administered 2012-08-31: 40 meq via ORAL
  Filled 2012-08-31: qty 2

## 2012-08-31 MED ORDER — PREDNISONE 20 MG PO TABS: 60.0000 mg | ORAL_TABLET | Freq: Every day | ORAL | Status: DC

## 2012-08-31 MED ORDER — ALBUTEROL SULFATE (5 MG/ML) 0.5% IN NEBU
5.0000 mg | INHALATION_SOLUTION | Freq: Once | RESPIRATORY_TRACT | Status: AC
  Administered 2012-08-31: 5 mg via RESPIRATORY_TRACT
  Filled 2012-08-31: qty 1

## 2012-08-31 MED ORDER — ALBUTEROL SULFATE HFA 108 (90 BASE) MCG/ACT IN AERS: 1.0000 | INHALATION_SPRAY | Freq: Four times a day (QID) | RESPIRATORY_TRACT | Status: DC | PRN

## 2012-08-31 MED ORDER — ALBUTEROL SULFATE HFA 108 (90 BASE) MCG/ACT IN AERS
2.0000 | INHALATION_SPRAY | RESPIRATORY_TRACT | Status: DC | PRN
  Administered 2012-08-31: 2 via RESPIRATORY_TRACT
  Filled 2012-08-31: qty 6.7

## 2012-08-31 MED ORDER — PREDNISONE 20 MG PO TABS
60.0000 mg | ORAL_TABLET | Freq: Once | ORAL | Status: AC
  Administered 2012-08-31: 60 mg via ORAL
  Filled 2012-08-31: qty 3

## 2012-08-31 NOTE — ED Provider Notes (Signed)
History    CSN: [id number] Arrival date & time 08/30/12  2307  First MD Initiated Contact with Patient 08/31/12 0016     Chief Complaint  Patient presents with  . Asthma   (Consider location/radiation/quality/duration/timing/severity/associated sxs/prior Treatment) HPI Hx per PT  - inc SOB and wheezing onset 2 days ago worse tonight, is a smoker, uncertain what her triggers are, no F/C, was admitted for asthma as a baby but since, last asthma attack 3-4 years ago.  Has some associated chest tightness, no productive cough. Symptoms mild to mod in severity. No leg pain or swelling. Feels like an asthma attack.   Past Medical History  Diagnosis Date  . Asthma   . Anxiety   . GERD (gastroesophageal reflux disease)   . Chlamydia    Past Surgical History  Procedure Laterality Date  . Vaginal delivery      X 1, no complications   Family History  Problem Relation Age of Onset  . Other Neg Hx   . Diabetes Mother   . Heart disease Mother   . Hypertension Mother   . Kidney disease Mother   . Arthritis Maternal Grandmother   . Asthma Maternal Grandmother   . Diabetes Maternal Grandmother   . Heart disease Maternal Grandmother   . Hypertension Maternal Grandmother   . Stroke Maternal Grandmother    History  Substance Use Topics  . Smoking status: Current Every Day Smoker -- 0.30 packs/day    Types: Cigarettes    Last Attempt to Quit: 09/14/2011  . Smokeless tobacco: Never Used  . Alcohol Use: No   OB History   Grav Para Term Preterm Abortions TAB SAB Ect Mult Living   2 2 2       2      Review of Systems  Constitutional: Negative for fever and chills.  HENT: Negative for neck pain and neck stiffness.   Eyes: Negative for pain.  Respiratory: Positive for shortness of breath and wheezing.   Cardiovascular: Negative for palpitations and leg swelling.  Gastrointestinal: Negative for abdominal pain.  Genitourinary: Negative for dysuria.  Musculoskeletal: Negative for back  pain.  Skin: Negative for rash.  Neurological: Negative for headaches.  All other systems reviewed and are negative.    Allergies  Review of patient's allergies indicates no known allergies.  Home Medications   Current Outpatient Rx  Name  Route  Sig  Dispense  Refill  . albuterol (PROVENTIL HFA;VENTOLIN HFA) 108 (90 BASE) MCG/ACT inhaler   Inhalation   Inhale 2 puffs into the lungs every 6 (six) hours as needed. For wheezing         . medroxyPROGESTERone (DEPO-PROVERA) 150 MG/ML injection   Intramuscular   Inject 1 mL (150 mg total) into the muscle every 3 (three) months.   1 mL   0    BP 137/85  Pulse 85  Temp(Src) 98.7 F (37.1 C) (Oral)  Resp 20  Ht 5\' 7"  (1.702 m)  Wt 147 lb (66.679 kg)  BMI 23.02 kg/m2  SpO2 100%  LMP 08/30/2012 Physical Exam  Constitutional: She is oriented to person, place, and time. She appears well-developed and well-nourished.  HENT:  Head: Normocephalic and atraumatic.  Eyes: EOM are normal. Pupils are equal, round, and reactive to light.  Neck: Neck supple.  Cardiovascular: Normal rate, regular rhythm and intact distal pulses.   Pulmonary/Chest: Effort normal. No respiratory distress.  mildly dec bilat breath sounds with some prolonged exp and exp wheezes  Abdominal: Soft.  She exhibits no distension. There is no tenderness.  Musculoskeletal: Normal range of motion. She exhibits no edema and no tenderness.  Neurological: She is alert and oriented to person, place, and time.  Skin: Skin is warm and dry.    ED Course  Procedures (including critical care time)  Results for orders placed during the hospital encounter of 08/30/12  CBC      Result Value Range   WBC 8.3  4.0 - 10.5 K/uL   RBC 4.25  3.87 - 5.11 MIL/uL   Hemoglobin 12.1  12.0 - 15.0 g/dL   HCT 16.1  09.6 - 04.5 %   MCV 84.9  78.0 - 100.0 fL   MCH 28.5  26.0 - 34.0 pg   MCHC 33.5  30.0 - 36.0 g/dL   RDW 40.9  81.1 - 91.4 %   Platelets 178  150 - 400 K/uL  BASIC  METABOLIC PANEL      Result Value Range   Sodium 137  135 - 145 mEq/L   Potassium 3.1 (*) 3.5 - 5.1 mEq/L   Chloride 103  96 - 112 mEq/L   CO2 23  19 - 32 mEq/L   Glucose, Bld 104 (*) 70 - 99 mg/dL   BUN 7  6 - 23 mg/dL   Creatinine, Ser 7.82  0.50 - 1.10 mg/dL   Calcium 9.3  8.4 - 95.6 mg/dL   GFR calc non Af Amer >90  >90 mL/min   GFR calc Af Amer >90  >90 mL/min   Dg Chest 2 View (if Patient Has Fever And/or Copd)  08/31/2012   *RADIOLOGY REPORT*  Clinical Data: Asthma with cough.  CHEST - 2 VIEW  Comparison: None.  Findings: No significant osseous abnormality.  Lungs are clear. No effusion or pneumothorax.  Cardiomediastinal size and contour are within normal limits.  The upper abdomen is unremarkable.  IMPRESSION: No evidence of acute cardiopulmonary disease.   Original Report Authenticated By: Tiburcio Pea     Albuterol Tx and prednisone provided  Recheck - subj improved and improved lungs sounds, still mildly symptomatic, repeat breathing Tx ordered.   12:45 AM feeling much better and wants to go home, lungs sounds clear/ improved. Return precautions and referrals provided.   RX pred and albuterol inhaler. Stable for discharge MDM  Asthma attack with h/o same  Medications provided  CXR ands labs obtained and reviewed as above  VS and nurses notes reviewed/ considered  Sunnie Nielsen, MD 08/31/12 2316

## 2012-09-09 ENCOUNTER — Emergency Department (HOSPITAL_COMMUNITY)
Admission: EM | Admit: 2012-09-09 | Discharge: 2012-09-09 | Disposition: A | Discharge status: Home / Self Care | Payer: Medicaid Other | Attending: Emergency Medicine | Admitting: Emergency Medicine

## 2012-09-09 ENCOUNTER — Encounter (HOSPITAL_COMMUNITY): Payer: Self-pay | Admitting: Emergency Medicine

## 2012-09-09 DIAGNOSIS — Z8659 Personal history of other mental and behavioral disorders: Secondary | ICD-10-CM | POA: Insufficient documentation

## 2012-09-09 DIAGNOSIS — N939 Abnormal uterine and vaginal bleeding, unspecified: Secondary | ICD-10-CM | POA: Insufficient documentation

## 2012-09-09 DIAGNOSIS — A5901 Trichomonal vulvovaginitis: Secondary | ICD-10-CM | POA: Insufficient documentation

## 2012-09-09 DIAGNOSIS — Z79899 Other long term (current) drug therapy: Secondary | ICD-10-CM | POA: Insufficient documentation

## 2012-09-09 DIAGNOSIS — J45909 Unspecified asthma, uncomplicated: Secondary | ICD-10-CM | POA: Insufficient documentation

## 2012-09-09 DIAGNOSIS — Z8619 Personal history of other infectious and parasitic diseases: Secondary | ICD-10-CM | POA: Insufficient documentation

## 2012-09-09 DIAGNOSIS — Z8719 Personal history of other diseases of the digestive system: Secondary | ICD-10-CM | POA: Insufficient documentation

## 2012-09-09 DIAGNOSIS — Z3202 Encounter for pregnancy test, result negative: Secondary | ICD-10-CM | POA: Insufficient documentation

## 2012-09-09 DIAGNOSIS — N926 Irregular menstruation, unspecified: Secondary | ICD-10-CM | POA: Insufficient documentation

## 2012-09-09 DIAGNOSIS — N898 Other specified noninflammatory disorders of vagina: Secondary | ICD-10-CM | POA: Insufficient documentation

## 2012-09-09 DIAGNOSIS — N949 Unspecified condition associated with female genital organs and menstrual cycle: Secondary | ICD-10-CM | POA: Insufficient documentation

## 2012-09-09 DIAGNOSIS — F17200 Nicotine dependence, unspecified, uncomplicated: Secondary | ICD-10-CM | POA: Insufficient documentation

## 2012-09-09 DIAGNOSIS — IMO0002 Reserved for concepts with insufficient information to code with codable children: Secondary | ICD-10-CM | POA: Insufficient documentation

## 2012-09-09 LAB — URINALYSIS, ROUTINE W REFLEX MICROSCOPIC
Bilirubin Urine: NEGATIVE
Hgb urine dipstick: NEGATIVE
Nitrite: NEGATIVE
Specific Gravity, Urine: 1.031 — ABNORMAL HIGH (ref 1.005–1.030)
pH: 6.5 (ref 5.0–8.0)

## 2012-09-09 LAB — WET PREP, GENITAL: Yeast Wet Prep HPF POC: NONE SEEN

## 2012-09-09 LAB — URINE MICROSCOPIC-ADD ON

## 2012-09-09 MED ORDER — METRONIDAZOLE 500 MG PO TABS: 500.0000 mg | ORAL_TABLET | Freq: Two times a day (BID) | ORAL | Status: DC

## 2012-09-09 NOTE — ED Notes (Signed)
C/o vaginal irritation, itching, grayish white discharge, has had previously when pregnant

## 2012-09-09 NOTE — ED Provider Notes (Signed)
Medical screening examination/treatment/procedure(s) were performed by non-physician practitioner and as supervising physician I was immediately available for consultation/collaboration.  Ethelda Chick, MD 09/09/12 1316

## 2012-09-09 NOTE — Progress Notes (Signed)
[facility] provided pt with a list of primary care resources, highlighting [facility].  °

## 2012-09-09 NOTE — ED Provider Notes (Signed)
History    CSN: [id number] Arrival date & time 09/09/12  1034  First MD Initiated Contact with Patient 09/09/12 1038     No chief complaint on file.  (Consider location/radiation/quality/duration/timing/severity/associated sxs/prior Treatment) HPI Comments: 22 year old female with a past history of Chlamydia, anxiety, asthma and GERD presents to the emergency department complaining of vaginal irritation x2 days. Patient states she recently switched her shaving cream, shaved around her pubic region a few days back and developed irritation and soreness. She has not tried any alleviating factors. States she has had vaginal spotting over the past 2 months without definite menstrual cycle. Had clear discharge this morning. Denies abdominal pain, nausea, vomiting, fever, chills. Admits to being sexually active with 1 female, orally and with toys.  The history is provided by the patient.   Past Medical History  Diagnosis Date  . Asthma   . Anxiety   . GERD (gastroesophageal reflux disease)   . Chlamydia    Past Surgical History  Procedure Laterality Date  . Vaginal delivery      X 1, no complications   Family History  Problem Relation Age of Onset  . Other Neg Hx   . Diabetes Mother   . Heart disease Mother   . Hypertension Mother   . Kidney disease Mother   . Arthritis Maternal Grandmother   . Asthma Maternal Grandmother   . Diabetes Maternal Grandmother   . Heart disease Maternal Grandmother   . Hypertension Maternal Grandmother   . Stroke Maternal Grandmother    History  Substance Use Topics  . Smoking status: Current Every Day Smoker -- 0.30 packs/day    Types: Cigarettes    Last Attempt to Quit: 09/14/2011  . Smokeless tobacco: Never Used  . Alcohol Use: No   OB History   Grav Para Term Preterm Abortions TAB SAB Ect Mult Living   2 2 2       2      Review of Systems  Constitutional: Negative for fever and chills.  Gastrointestinal: Negative for nausea, vomiting  and abdominal pain.  Genitourinary: Positive for vaginal bleeding, vaginal discharge, vaginal pain and menstrual problem. Negative for dysuria, urgency, frequency, flank pain, difficulty urinating and pelvic pain.  Musculoskeletal: Negative for back pain.  All other systems reviewed and are negative.    Allergies  Review of patient's allergies indicates no known allergies.  Home Medications   Current Outpatient Rx  Name  Route  Sig  Dispense  Refill  . albuterol (PROVENTIL HFA;VENTOLIN HFA) 108 (90 BASE) MCG/ACT inhaler   Inhalation   Inhale 2 puffs into the lungs every 6 (six) hours as needed. For wheezing         . albuterol (PROVENTIL HFA;VENTOLIN HFA) 108 (90 BASE) MCG/ACT inhaler   Inhalation   Inhale 1-2 puffs into the lungs every 6 (six) hours as needed for wheezing.   1 Inhaler   0   . medroxyPROGESTERone (DEPO-PROVERA) 150 MG/ML injection   Intramuscular   Inject 1 mL (150 mg total) into the muscle every 3 (three) months.   1 mL   0   . predniSONE (DELTASONE) 20 MG tablet   Oral   Take 3 tablets (60 mg total) by mouth daily.   15 tablet   0    BP 130/84  Pulse 69  Temp(Src) 99.3 F (37.4 C) (Oral)  Resp 18  SpO2 97%  LMP 08/30/2012 Physical Exam  Constitutional: She is oriented to person, place, and time. She appears  well-developed and well-nourished. No distress.  HENT:  Head: Normocephalic and atraumatic.  Mouth/Throat: Oropharynx is clear and moist.  Eyes: Conjunctivae are normal.  Neck: Normal range of motion. Neck supple.  Cardiovascular: Normal rate, regular rhythm and normal heart sounds.   Pulmonary/Chest: Effort normal and breath sounds normal.  Abdominal: Soft. Bowel sounds are normal. She exhibits no distension. There is no tenderness.  Genitourinary: Uterus normal. There is no rash, tenderness, lesion or injury on the right labia. There is no rash, tenderness, lesion or injury on the left labia. Uterus is not enlarged, not fixed and not  tender. Cervix exhibits no motion tenderness, no discharge and no friability. Right adnexum displays no mass, no tenderness and no fullness. Left adnexum displays no mass, no tenderness and no fullness. There is tenderness around the vagina. No erythema or bleeding around the vagina. No foreign body around the vagina. No signs of injury around the vagina. Vaginal discharge (foul odor, gray/white) found.  Musculoskeletal: Normal range of motion. She exhibits no edema.  Neurological: She is alert and oriented to person, place, and time.  Skin: Skin is warm and dry. She is not diaphoretic.  Psychiatric: She has a normal mood and affect. Her behavior is normal.    ED Course  Procedures (including critical care time) Labs Reviewed  WET PREP, GENITAL - Abnormal; Notable for the following:    Trich, Wet Prep FEW (*)    Clue Cells Wet Prep HPF POC FEW (*)    WBC, Wet Prep HPF POC TOO NUMEROUS TO COUNT (*)    All other components within normal limits  URINALYSIS, ROUTINE W REFLEX MICROSCOPIC - Abnormal; Notable for the following:    APPearance TURBID (*)    Specific Gravity, Urine 1.031 (*)    Leukocytes, UA MODERATE (*)    All other components within normal limits  URINE MICROSCOPIC-ADD ON - Abnormal; Notable for the following:    Bacteria, UA FEW (*)    Crystals CA OXALATE CRYSTALS (*)    All other components within normal limits  GC/CHLAMYDIA PROBE AMP  URINE CULTURE  POCT PREGNANCY, URINE   No results found. 1. Trichomonas vaginitis     MDM  Patient with trichomonas. Will treat with flagyl. No concern of PID. No abdominal pain, adnexal tenderness. She is afebrile and in NAD. Return precautions discussed. Patient states understanding of plan and is agreeable.   Trevor Mace, PA-C 09/09/12 1314

## 2012-09-10 LAB — URINE CULTURE: Colony Count: 25000

## 2012-10-04 ENCOUNTER — Ambulatory Visit: Payer: Self-pay | Admitting: Family

## 2012-10-04 ENCOUNTER — Ambulatory Visit: Payer: Medicaid Other

## 2012-10-05 ENCOUNTER — Emergency Department (HOSPITAL_COMMUNITY)
Admission: EM | Admit: 2012-10-05 | Discharge: 2012-10-05 | Disposition: A | Discharge status: Home / Self Care | Payer: Medicaid Other | Attending: Emergency Medicine | Admitting: Emergency Medicine

## 2012-10-05 ENCOUNTER — Encounter (HOSPITAL_COMMUNITY): Payer: Self-pay | Admitting: Emergency Medicine

## 2012-10-05 DIAGNOSIS — L0291 Cutaneous abscess, unspecified: Secondary | ICD-10-CM

## 2012-10-05 DIAGNOSIS — Z79899 Other long term (current) drug therapy: Secondary | ICD-10-CM | POA: Insufficient documentation

## 2012-10-05 DIAGNOSIS — L02219 Cutaneous abscess of trunk, unspecified: Secondary | ICD-10-CM | POA: Insufficient documentation

## 2012-10-05 DIAGNOSIS — R112 Nausea with vomiting, unspecified: Secondary | ICD-10-CM | POA: Insufficient documentation

## 2012-10-05 DIAGNOSIS — F17200 Nicotine dependence, unspecified, uncomplicated: Secondary | ICD-10-CM | POA: Insufficient documentation

## 2012-10-05 DIAGNOSIS — Z8719 Personal history of other diseases of the digestive system: Secondary | ICD-10-CM | POA: Insufficient documentation

## 2012-10-05 DIAGNOSIS — L02419 Cutaneous abscess of limb, unspecified: Secondary | ICD-10-CM | POA: Insufficient documentation

## 2012-10-05 DIAGNOSIS — J45909 Unspecified asthma, uncomplicated: Secondary | ICD-10-CM | POA: Insufficient documentation

## 2012-10-05 DIAGNOSIS — Z8619 Personal history of other infectious and parasitic diseases: Secondary | ICD-10-CM | POA: Insufficient documentation

## 2012-10-05 DIAGNOSIS — Z8659 Personal history of other mental and behavioral disorders: Secondary | ICD-10-CM | POA: Insufficient documentation

## 2012-10-05 HISTORY — DX: Trichomoniasis, unspecified: A59.9

## 2012-10-05 MED ORDER — SULFAMETHOXAZOLE-TRIMETHOPRIM 800-160 MG PO TABS: 1.0000 | ORAL_TABLET | Freq: Two times a day (BID) | ORAL | Status: DC

## 2012-10-05 NOTE — ED Provider Notes (Signed)
Medical screening examination/treatment/procedure(s) were conducted as a shared visit with non-physician practitioner(s) and myself.  I personally evaluated the patient during the encounter and agree with physical exam and plan of care.   Patient is a 22 year old female with a history of asthma, anxiety, GERD who presents emergency department with a 4 x 5 cm fluctuant area into her right posterior thigh and has been present for several days. No drainage. She is mildly febrile and mildly tachycardic. Able to visualize a 2 x 2 cm fluid pocket on bedside ultrasound. Will I&D and started on Bactrim given her surrounding cellulitis.  She is not breast-feeding. She is otherwise very well-appearing, nontoxic, well-hydrated. Anticipate discharge home with outpatient followup.  Marissa Maw Djon Tith, DO 10/05/12 1700

## 2012-10-05 NOTE — ED Provider Notes (Signed)
CSN: [id number]     Arrival date & time 10/05/12  1458 History    This chart was scribed for Marissa Horseman, PA-C, working with Layla Maw Ward, DO, by Yevette Edwards, ED Scribe. This patient was seen in room WTR9/WTR9 and the patient's care was started at 4:25 PM.   First MD Initiated Contact with Patient 10/05/12 1542     Chief Complaint  Patient presents with  . Skin Abcess     The history is provided by the patient.   HPI Comments: Marissa Lucas is a 22 y.o. female, with a h/o abscesses, who presents to the Emergency Department whose chief complain is two abscess which she first noticed four days ago. The abscesses are located to her abdomen and her right lateral thigh.The pt has experienced nausea and emesis; she was unsure if she has had a fever. She is also unsure if she was bitten by any insects or exposed to an environmental allergen. She denies any known medication allergies.   Past Medical History  Diagnosis Date  . Asthma   . Anxiety   . GERD (gastroesophageal reflux disease)   . Trichomonas    Past Surgical History  Procedure Laterality Date  . Vaginal delivery      X 1, no complications   Family History  Problem Relation Age of Onset  . Other Neg Hx   . Diabetes Mother   . Heart disease Mother   . Hypertension Mother   . Kidney disease Mother   . Arthritis Maternal Grandmother   . Asthma Maternal Grandmother   . Diabetes Maternal Grandmother   . Heart disease Maternal Grandmother   . Hypertension Maternal Grandmother   . Stroke Maternal Grandmother    History  Substance Use Topics  . Smoking status: Current Every Day Smoker -- 0.30 packs/day    Types: Cigarettes    Last Attempt to Quit: 09/14/2011  . Smokeless tobacco: Never Used  . Alcohol Use: No   OB History   Grav Para Term Preterm Abortions TAB SAB Ect Mult Living   2 2 2       2      Review of Systems  A complete 10 system review of systems was obtained, and all systems were negative except  where indicated in the HPI and PE.   Allergies  Review of patient's allergies indicates no known allergies.  Home Medications   Current Outpatient Rx  Name  Route  Sig  Dispense  Refill  . albuterol (PROVENTIL HFA;VENTOLIN HFA) 108 (90 BASE) MCG/ACT inhaler   Inhalation   Inhale 1-2 puffs into the lungs every 6 (six) hours as needed for wheezing.   1 Inhaler   0    Triage Vitals: BP 112/60  Pulse 109  Temp(Src) 99.9 F (37.7 C) (Oral)  SpO2 100%  LMP 10/05/2012  Physical Exam  Nursing note and vitals reviewed. Constitutional: She is oriented to person, place, and time. She appears well-developed and well-nourished. No distress.  HENT:  Head: Normocephalic and atraumatic.  Eyes: EOM are normal.  Neck: Neck supple. No tracheal deviation present.  Cardiovascular:  Tachycardic.   Pulmonary/Chest: Effort normal. No respiratory distress.  Musculoskeletal: Normal range of motion.  Neurological: She is alert and oriented to person, place, and time.  Skin: Skin is warm and dry.  6 by 6 cm abscess to the right posterior thigh, non-fluctuant, non-purulent with surroudning erythema. No drainage at this time.   3 by 3 cm abscess to the right abdomen  with mild surrounding erythema and mild fluctuance.   Psychiatric: She has a normal mood and affect. Her behavior is normal.    ED Course   DIAGNOSTIC STUDIES:  Oxygen Saturation is 100% on room air, normal by my interpretation.    COORDINATION OF CARE:  4:30 PM- Consulted with Dr. Elesa Massed regarding the boils.   Procedures (including critical care time)  INCISION AND DRAINAGE Performed by: Marissa Lucas Consent: Verbal consent obtained. Risks and benefits: risks, benefits and alternatives were discussed Type: abscess  Body area: Posterior right thigh  Anesthesia: local infiltration  Incision was made with a scalpel.  Local anesthetic: lidocaine 2 % with epinephrine  Anesthetic total: 4 ml  Complexity:  complex Blunt dissection to break up loculations  Drainage: purulent  Drainage amount: Copious   Patient tolerance: Patient tolerated the procedure well with no immediate complications.   INCISION AND DRAINAGE Performed by: Marissa Lucas Consent: Verbal consent obtained. Risks and benefits: risks, benefits and alternatives were discussed Type: abscess  Body area: Abdomen  Anesthesia: local infiltration  Incision was made with a scalpel.  Local anesthetic: lidocaine 2 % with epinephrine  Anesthetic total: 4 ml  Complexity: complex Blunt dissection to break up loculations  Drainage: purulent  Drainage amount: Moderate    Patient tolerance: Patient tolerated the procedure well with no immediate complications.     Labs Reviewed - No data to display No results found. 1. Abscess     MDM  Patient with skin abscess amenable to incision and drainage.  Abscess was not large enough to warrant packing or drain. Encouraged home warm soaks and flushing.  Mild signs of cellulitis is surrounding skin.  Will d/c to home.    Patient seen by and discussed with Dr. Elesa Massed, who agrees with treatment and discharge plan.  I personally performed the services described in this documentation, which was scribed in my presence. The recorded information has been reviewed and is accurate.     Marissa Horseman, PA-C 10/05/12 1800

## 2012-10-05 NOTE — ED Notes (Signed)
Pt has large area on her R buttock that appears to be an abcess and also states she has another on her abdomen that is smaller. States they have not been draining. Painful. Febrile.

## 2012-11-18 ENCOUNTER — Ambulatory Visit: Payer: Self-pay | Admitting: Advanced Practice Midwife

## 2012-12-23 ENCOUNTER — Other Ambulatory Visit: Payer: Self-pay

## 2013-01-28 ENCOUNTER — Encounter (HOSPITAL_COMMUNITY): Payer: Self-pay | Admitting: Emergency Medicine

## 2013-01-28 ENCOUNTER — Emergency Department (HOSPITAL_COMMUNITY)
Admission: EM | Admit: 2013-01-28 | Discharge: 2013-01-28 | Disposition: A | Discharge status: Home / Self Care | Payer: Medicaid Other | Attending: Emergency Medicine | Admitting: Emergency Medicine

## 2013-01-28 DIAGNOSIS — B9689 Other specified bacterial agents as the cause of diseases classified elsewhere: Secondary | ICD-10-CM

## 2013-01-28 DIAGNOSIS — F17200 Nicotine dependence, unspecified, uncomplicated: Secondary | ICD-10-CM | POA: Insufficient documentation

## 2013-01-28 DIAGNOSIS — J45909 Unspecified asthma, uncomplicated: Secondary | ICD-10-CM | POA: Insufficient documentation

## 2013-01-28 DIAGNOSIS — R300 Dysuria: Secondary | ICD-10-CM | POA: Insufficient documentation

## 2013-01-28 DIAGNOSIS — Z3202 Encounter for pregnancy test, result negative: Secondary | ICD-10-CM | POA: Insufficient documentation

## 2013-01-28 DIAGNOSIS — Z79899 Other long term (current) drug therapy: Secondary | ICD-10-CM | POA: Insufficient documentation

## 2013-01-28 DIAGNOSIS — N760 Acute vaginitis: Secondary | ICD-10-CM | POA: Insufficient documentation

## 2013-01-28 LAB — WET PREP, GENITAL

## 2013-01-28 MED ORDER — METRONIDAZOLE 500 MG PO TABS: 500.0000 mg | ORAL_TABLET | Freq: Two times a day (BID) | ORAL | Status: DC

## 2013-01-28 NOTE — ED Provider Notes (Signed)
CSN: [id number]     Arrival date & time 01/28/13  1122 History   First MD Initiated Contact with Patient 01/28/13 1147     Chief Complaint  Patient presents with  . Vaginal Discharge   (Consider location/radiation/quality/duration/timing/severity/associated sxs/prior Treatment) Patient is a 22 y.o. female presenting with vaginal discharge. No language interpreter was used.  Vaginal Discharge Quality:  White Severity:  Mild Duration:  3 days Context: spontaneously   Context: not genital trauma and not recent antibiotic use   Associated symptoms: dysuria and vaginal itching   Associated symptoms: no abdominal pain, no fever, no genital lesions, no nausea and no vomiting   Dysuria:    Severity:  Mild Risk factors: no new sexual partner   Pt is a [id number], 22 year old female who presents with vaginal discharge and itching that has been going on for approx 2-3 days.She denies fever, chills or abdominal pain. She reports that she has had a whitish vaginal discharge and has had some mild-moderate itching externally and internally. She denies any new sexual partners however, she reports that her current partner has given her trich before. She reports some burning on urination. She denies hematuria, back pain, nausea, vomiting or diarrhea. No recent illness or exposure. She reports that her last LMP was 2-3 weeks ago.     Past Medical History  Diagnosis Date  . Asthma   . Anxiety   . GERD (gastroesophageal reflux disease)   . Trichomonas    Past Surgical History  Procedure Laterality Date  . Vaginal delivery      X 1, no complications   Family History  Problem Relation Age of Onset  . Other Neg Hx   . Diabetes Mother   . Heart disease Mother   . Hypertension Mother   . Kidney disease Mother   . Arthritis Maternal Grandmother   . Asthma Maternal Grandmother   . Diabetes Maternal Grandmother   . Heart disease Maternal Grandmother   . Hypertension Maternal Grandmother   . Stroke  Maternal Grandmother    History  Substance Use Topics  . Smoking status: Current Every Day Smoker -- 0.30 packs/day    Types: Cigarettes    Last Attempt to Quit: 09/14/2011  . Smokeless tobacco: Never Used  . Alcohol Use: No   OB History   Grav Para Term Preterm Abortions TAB SAB Ect Mult Living   2 2 2       2      Review of Systems  Constitutional: Negative for fever and chills.  Respiratory: Negative for cough, chest tightness and shortness of breath.   Gastrointestinal: Negative for nausea, vomiting, abdominal pain, diarrhea and abdominal distention.  Genitourinary: Positive for dysuria and vaginal discharge. Negative for urgency.  Musculoskeletal: Negative for back pain.  All other systems reviewed and are negative.    Allergies  Review of patient's allergies indicates no known allergies.  Home Medications   Current Outpatient Rx  Name  Route  Sig  Dispense  Refill  . albuterol (PROVENTIL HFA;VENTOLIN HFA) 108 (90 BASE) MCG/ACT inhaler   Inhalation   Inhale 1-2 puffs into the lungs every 6 (six) hours as needed for wheezing.   1 Inhaler   0    BP 113/89  Pulse 97  Temp(Src) 98.6 F (37 C) (Oral)  Resp 17  LMP 01/07/2013 Physical Exam  Nursing note and vitals reviewed. Constitutional: She is oriented to person, place, and time. She appears well-developed and well-nourished. No distress.  HENT:  Head: Normocephalic and atraumatic.  Eyes: Conjunctivae and EOM are normal. Pupils are equal, round, and reactive to light.  Neck: Normal range of motion. Neck supple. No JVD present. No tracheal deviation present. No thyromegaly present.  Cardiovascular: Normal rate, regular rhythm, normal heart sounds and intact distal pulses.   Pulmonary/Chest: Effort normal and breath sounds normal. No respiratory distress. She has no wheezes.  Abdominal: Soft. Bowel sounds are normal. She exhibits no distension. There is no tenderness.  Musculoskeletal: Normal range of motion.   Lymphadenopathy:    She has no cervical adenopathy.  Neurological: She is alert and oriented to person, place, and time.  Skin: Skin is warm and dry.  Psychiatric: She has a normal mood and affect. Her behavior is normal. Judgment and thought content normal.    ED Course  Procedures (including critical care time) Labs Review Labs Reviewed  GC/CHLAMYDIA PROBE AMP  WET PREP, GENITAL  URINALYSIS, ROUTINE W REFLEX MICROSCOPIC  PREGNANCY, URINE   Imaging Review No results found.  EKG Interpretation   None       MDM   1. BV (bacterial vaginosis)    Vaginal irritation and itching. No abdominal or pelvic pain. No CMT or adnexal tenderness or fullness. Wet prep + for clue cells. GC/Chlamydia pending, will call if +. No fever or chills. Mild dysuria. First urine specimen spilled in lab and pt unable to obtain another one now. She reports that she doesn't want to wait to get another urine sample because she needs to go to work. Metronidazole for BV. Discussed meds, no alcohol, return if dysuria worsens or if other symptoms worsen.        Irish Elders, NP 01/28/13 1520

## 2013-01-28 NOTE — Progress Notes (Signed)
   CARE MANAGEMENT ED NOTE 01/28/2013  Patient:  Marissa Lucas, Marissa Lucas   Account Number:  [id number]  Date Initiated:  01/28/2013  Documentation initiated by:  Edd Arbour  Subjective/Objective Assessment:   22 yr old [place] access pta who state she did not like the MD assigned to her by [facility] name listed on scanned medicaid card in EPIC and is pending a change of pcp per pt Recently had a female infant and continues to receive service     Subjective/Objective Assessment Detail:   c/o vaginal irritation that has been going on for couple days and noticed white discharge yesterday without an odor.     4 ED visits no admission in last 6 months     Action/Plan:   Action/Plan Detail:   Cm provided her with a list of [place] providers to [employer] her to review the list to find another accepting provider to request MD change on her medicaid card   Anticipated DC Date:  01/28/2013     Status Recommendation to Physician:   Result of Recommendation:    Other ED Services  Consult Working Plan    DC Planning Services  PCP issues  Other  Outpatient Services - Pt will follow up    Choice offered to / List presented to:            Status of service:  Completed, signed off  ED Comments:   ED Comments Detail:

## 2013-01-28 NOTE — ED Notes (Signed)
Pt has vaginal irritation that has been going on for couple days and noticed white discharge yesterday without an odor.

## 2013-01-29 LAB — GC/CHLAMYDIA PROBE AMP
CT Probe RNA: NEGATIVE
GC Probe RNA: NEGATIVE

## 2013-01-31 NOTE — ED Provider Notes (Signed)
Medical screening examination/treatment/procedure(s) were performed by non-physician practitioner and as supervising physician I was immediately available for consultation/collaboration.  EKG Interpretation   None        Toy Baker, MD 01/31/13 [phone]

## 2013-05-28 ENCOUNTER — Encounter (HOSPITAL_COMMUNITY): Payer: Self-pay | Admitting: Emergency Medicine

## 2013-05-28 ENCOUNTER — Emergency Department (HOSPITAL_COMMUNITY)
Admission: EM | Admit: 2013-05-28 | Discharge: 2013-05-28 | Disposition: A | Discharge status: Home / Self Care | Payer: Medicaid Other | Attending: Emergency Medicine | Admitting: Emergency Medicine

## 2013-05-28 DIAGNOSIS — Z8659 Personal history of other mental and behavioral disorders: Secondary | ICD-10-CM | POA: Insufficient documentation

## 2013-05-28 DIAGNOSIS — J45909 Unspecified asthma, uncomplicated: Secondary | ICD-10-CM | POA: Insufficient documentation

## 2013-05-28 DIAGNOSIS — Z79899 Other long term (current) drug therapy: Secondary | ICD-10-CM | POA: Insufficient documentation

## 2013-05-28 DIAGNOSIS — F17200 Nicotine dependence, unspecified, uncomplicated: Secondary | ICD-10-CM | POA: Insufficient documentation

## 2013-05-28 DIAGNOSIS — Z8719 Personal history of other diseases of the digestive system: Secondary | ICD-10-CM | POA: Insufficient documentation

## 2013-05-28 DIAGNOSIS — N390 Urinary tract infection, site not specified: Secondary | ICD-10-CM | POA: Insufficient documentation

## 2013-05-28 DIAGNOSIS — Z8619 Personal history of other infectious and parasitic diseases: Secondary | ICD-10-CM | POA: Insufficient documentation

## 2013-05-28 DIAGNOSIS — Z3202 Encounter for pregnancy test, result negative: Secondary | ICD-10-CM | POA: Insufficient documentation

## 2013-05-28 LAB — URINE MICROSCOPIC-ADD ON

## 2013-05-28 LAB — URINALYSIS, ROUTINE W REFLEX MICROSCOPIC
Bilirubin Urine: NEGATIVE
GLUCOSE, UA: NEGATIVE mg/dL
KETONES UR: NEGATIVE mg/dL
NITRITE: NEGATIVE
PH: 6 (ref 5.0–8.0)
PROTEIN: 100 mg/dL — AB
Specific Gravity, Urine: 1.016 (ref 1.005–1.030)
Urobilinogen, UA: 0.2 mg/dL (ref 0.0–1.0)

## 2013-05-28 LAB — POC URINE PREG, ED: PREG TEST UR: NEGATIVE

## 2013-05-28 MED ORDER — PHENAZOPYRIDINE HCL 200 MG PO TABS: 200.0000 mg | ORAL_TABLET | Freq: Three times a day (TID) | ORAL | Status: DC

## 2013-05-28 MED ORDER — ACETAMINOPHEN 325 MG PO TABS
650.0000 mg | ORAL_TABLET | Freq: Once | ORAL | Status: AC
  Administered 2013-05-28: 650 mg via ORAL
  Filled 2013-05-28: qty 2

## 2013-05-28 MED ORDER — CEPHALEXIN 500 MG PO CAPS: 500.0000 mg | ORAL_CAPSULE | Freq: Three times a day (TID) | ORAL | Status: DC

## 2013-05-28 NOTE — Discharge Instructions (Signed)
Urinary Tract Infection  Urinary tract infections (UTIs) can develop anywhere along your urinary tract. Your urinary tract is your body's drainage system for removing wastes and extra water. Your urinary tract includes two kidneys, two ureters, a bladder, and a urethra. Your kidneys are a pair of bean-shaped organs. Each kidney is about the size of your fist. They are located below your ribs, one on each side of your spine.  CAUSES  Infections are caused by microbes, which are microscopic organisms, including fungi, viruses, and bacteria. These organisms are so small that they can only be seen through a microscope. Bacteria are the microbes that most commonly cause UTIs.  SYMPTOMS   Symptoms of UTIs may vary by age and gender of the patient and by the location of the infection. Symptoms in young women typically include a frequent and intense urge to urinate and a painful, burning feeling in the bladder or urethra during urination. Older women and men are more likely to be tired, shaky, and weak and have muscle aches and abdominal pain. A fever may mean the infection is in your kidneys. Other symptoms of a kidney infection include pain in your back or sides below the ribs, nausea, and vomiting.  DIAGNOSIS  To diagnose a UTI, your caregiver will ask you about your symptoms. Your caregiver also will ask to provide a urine sample. The urine sample will be tested for bacteria and white blood cells. White blood cells are made by your body to help fight infection.  TREATMENT   Typically, UTIs can be treated with medication. Because most UTIs are caused by a bacterial infection, they usually can be treated with the use of antibiotics. The choice of antibiotic and length of treatment depend on your symptoms and the type of bacteria causing your infection.  HOME CARE INSTRUCTIONS   If you were prescribed antibiotics, take them exactly as your caregiver instructs you. Finish the medication even if you feel better after you  have only taken some of the medication.   Drink enough water and fluids to keep your urine clear or pale yellow.   Avoid caffeine, tea, and carbonated beverages. They tend to irritate your bladder.   Empty your bladder often. Avoid holding urine for long periods of time.   Empty your bladder before and after sexual intercourse.   After a bowel movement, women should cleanse from front to back. Use each tissue only once.  SEEK MEDICAL CARE IF:    You have back pain.   You develop a fever.   Your symptoms do not begin to resolve within 3 days.  SEEK IMMEDIATE MEDICAL CARE IF:    You have severe back pain or lower abdominal pain.   You develop chills.   You have nausea or vomiting.   You have continued burning or discomfort with urination.  MAKE SURE YOU:    Understand these instructions.   Will watch your condition.   Will get help right away if you are not doing well or get worse.  Document Released: 11/13/2004 Document Revised: 08/05/2011 Document Reviewed: 03/14/2011  ExitCare Patient Information 2014 [employer].

## 2013-05-28 NOTE — ED Notes (Signed)
Patient is alert and oriented x3.  She is complaining of back pain that started about 4 days ago. She states that she also had been feeling like she has a UTI and took AZO over the counter  Medication with some relief but her back has continued to hurt.  Currently she rates her pain 8 of 10.

## 2013-05-28 NOTE — ED Provider Notes (Signed)
CSN: [id number]     Arrival date & time 05/28/13  1913 History   First MD Initiated Contact with Patient 05/28/13 2015     Chief Complaint  Patient presents with  . Back Pain    HPI Pt started with urinary frequency a few days ago.  It was not really painful but she had an abormal sensation when urinating.  She tooks some azo which helped but the symptoms did not go away.  She then started having some pain in her back.  NO fever or vomiting.  Some nausea last night.  NO vaginal discharge.  Past Medical History  Diagnosis Date  . Asthma   . Anxiety   . GERD (gastroesophageal reflux disease)   . Trichomonas    Past Surgical History  Procedure Laterality Date  . Vaginal delivery      X 1, no complications   Family History  Problem Relation Age of Onset  . Other Neg Hx   . Diabetes Mother   . Heart disease Mother   . Hypertension Mother   . Kidney disease Mother   . Arthritis Maternal Grandmother   . Asthma Maternal Grandmother   . Diabetes Maternal Grandmother   . Heart disease Maternal Grandmother   . Hypertension Maternal Grandmother   . Stroke Maternal Grandmother    History  Substance Use Topics  . Smoking status: Current Every Day Smoker -- 0.30 packs/day    Types: Cigarettes    Last Attempt to Quit: 09/14/2011  . Smokeless tobacco: Never Used  . Alcohol Use: No   OB History   Grav Para Term Preterm Abortions TAB SAB Ect Mult Living   2 2 2       2      Review of Systems  All other systems reviewed and are negative.     Allergies  Review of patient's allergies indicates no known allergies.  Home Medications   Current Outpatient Rx  Name  Route  Sig  Dispense  Refill  . albuterol (PROVENTIL HFA;VENTOLIN HFA) 108 (90 BASE) MCG/ACT inhaler   Inhalation   Inhale 1 puff into the lungs every 6 (six) hours as needed for wheezing or shortness of breath.          BP 118/74  Pulse 99  Temp(Src) 98.4 F (36.9 C) (Oral)  Resp 16  SpO2 99%  LMP  05/21/2013 Physical Exam  Nursing note and vitals reviewed. Constitutional: She appears well-developed and well-nourished. No distress.  HENT:  Head: Normocephalic and atraumatic.  Right Ear: External ear normal.  Left Ear: External ear normal.  Eyes: Conjunctivae are normal. Right eye exhibits no discharge. Left eye exhibits no discharge. No scleral icterus.  Neck: Neck supple. No tracheal deviation present.  Cardiovascular: Normal rate, regular rhythm and intact distal pulses.   Pulmonary/Chest: Effort normal and breath sounds normal. No stridor. No respiratory distress. She has no wheezes. She has no rales.  Abdominal: Soft. Bowel sounds are normal. She exhibits no distension. There is no tenderness. There is no rebound and no guarding.  Mild ttp right cva region  Musculoskeletal: She exhibits no edema and no tenderness.  Neurological: She is alert. She has normal strength. No cranial nerve deficit (no facial droop, extraocular movements intact, no slurred speech) or sensory deficit. She exhibits normal muscle tone. She displays no seizure activity. Coordination normal.  Skin: Skin is warm and dry. No rash noted.  Psychiatric: She has a normal mood and affect.    ED Course  Procedures (including critical care time) Labs Review Labs Reviewed  URINALYSIS, ROUTINE W REFLEX MICROSCOPIC - Abnormal; Notable for the following:    APPearance CLOUDY (*)    Hgb urine dipstick MODERATE (*)    Protein, ur 100 (*)    Leukocytes, UA LARGE (*)    All other components within normal limits  URINE MICROSCOPIC-ADD ON - Abnormal; Notable for the following:    Bacteria, UA FEW (*)    All other components within normal limits  URINE CULTURE  POC URINE PREG, ED    MDM   Final diagnoses:  UTI (urinary tract infection)    Afebrile.  No vomiting or abdominal pain.  Doubt pyelo.  Will treat for uncomplicated uti.  Keflex, pyridium    Celene Kras, MD 05/28/13 2155

## 2013-05-28 NOTE — ED Notes (Signed)
Pt has complaints of frequent urination for 4 days,  She has had back pain for 1 to 2 days.  Pt is taking otc azo standard  States she feels some better,  Urine sample collected

## 2013-05-31 LAB — URINE CULTURE: Colony Count: >=100000

## 2013-06-01 NOTE — ED Notes (Signed)
[  X] Treated with cephalexin, organism resistant to prescribed antimicrobial   New antibiotic prescription: Bactrim DS, take 1 tablet twice daily for 3 days  ED Provider: Raymon MuttonMarissa Sciacca, PA-C   Shelba FlakeNathan E. Achilles Dunkope, PharmD

## 2013-06-01 NOTE — Progress Notes (Addendum)
ED Antimicrobial Stewardship Positive Culture Follow Up   Marissa RedoKendra Lucas is an 23 y.o. female who [facility] with a chief complaint of  Chief Complaint  Patient presents with  . Back Pain    Recent Results (from the past 720 hour(s))  URINE CULTURE     Status: None   Collection Time    05/28/13  8:25 PM      Result Value Ref Range Status   Specimen Description URINE, CLEAN CATCH   Final   Special Requests NONE   Final   Culture  Setup Time     Final   Value: 05/29/2013 02:12     Performed at [facility]   Colony Count     Final   Value: >=100,000 COLONIES/ML     Performed at [facility]   Culture     Final   Value: STAPHYLOCOCCUS SPECIES (COAGULASE NEGATIVE)     Note: RIFAMPIN AND GENTAMICIN SHOULD NOT BE USED AS SINGLE DRUGS FOR TREATMENT OF STAPH INFECTIONS.     Performed at [facility]   Report Status 05/31/2013 FINAL   Final   Organism ID, Bacteria STAPHYLOCOCCUS SPECIES (COAGULASE NEGATIVE)   Final    [x]  Treated with cephalexin, organism resistant to prescribed antimicrobial []  Patient discharged originally without antimicrobial agent and treatment is now indicated   New antibiotic prescription: Bactrim DS, take 1 tablet twice daily for 3 days  ED Provider: Raymon MuttonMarissa Sciacca, PA-C  Shelba FlakeNathan E. Achilles Dunkope, PharmD Clinical Pharmacist - Resident Pager: [phone] Pharmacy: [phone] 06/01/2013 5:01 PM

## 2013-06-04 ENCOUNTER — Telehealth (HOSPITAL_BASED_OUTPATIENT_CLINIC_OR_DEPARTMENT_OTHER): Payer: Self-pay | Admitting: Emergency Medicine

## 2013-06-11 ENCOUNTER — Telehealth (HOSPITAL_BASED_OUTPATIENT_CLINIC_OR_DEPARTMENT_OTHER): Payer: Self-pay

## 2013-06-11 NOTE — Telephone Encounter (Signed)
Pt rcvd letter.  Pt informed of Dx & need for addl tx.  Rx for Bactrim DS called to [facility] [phone] and loft on [address].

## 2013-08-18 ENCOUNTER — Ambulatory Visit (INDEPENDENT_AMBULATORY_CARE_PROVIDER_SITE_OTHER): Payer: Medicaid Other | Admitting: Obstetrics & Gynecology

## 2013-08-18 ENCOUNTER — Encounter: Payer: Self-pay | Admitting: Obstetrics & Gynecology

## 2013-08-18 VITALS — BP 117/79 | HR 90 | Temp 97.8°F | Ht 67.0 in | Wt 143.2 lb

## 2013-08-18 DIAGNOSIS — Z3009 Encounter for other general counseling and advice on contraception: Secondary | ICD-10-CM

## 2013-08-18 LAB — POCT PREGNANCY, URINE: Preg Test, Ur: NEGATIVE

## 2013-08-18 MED ORDER — MEDROXYPROGESTERONE ACETATE 104 MG/0.65ML ~~LOC~~ SUSP
104.0000 mg | Freq: Once | SUBCUTANEOUS | Status: AC
  Administered 2013-08-18: 104 mg via SUBCUTANEOUS

## 2013-08-18 NOTE — Progress Notes (Signed)
Subjective:     Patient ID: Luther RedoKendra Petrides, female   DOB: February 26, 1990, 23 y.o.   MRN: [id number]  HPI {t presents for contraception.  Wants PAP when she is 23years.of age. LMP 08/09/2013.  Denies unprotecteed intercourse since that time.  Prev on Depo Provera.  No problems   Review of Systems     Objective:   Physical ExamPt in NAD Exam deferred     Assessment:     contraception counseling- no contraindications to Depo      Plan:     Depo Provera 104mg  SQ preg test F/u in 3months when 23years for annual with PAP

## 2013-08-18 NOTE — Patient Instructions (Signed)

## 2013-09-03 ENCOUNTER — Emergency Department (HOSPITAL_COMMUNITY)
Admission: EM | Admit: 2013-09-03 | Discharge: 2013-09-03 | Disposition: A | Discharge status: Home / Self Care | Payer: Medicaid Other | Attending: Emergency Medicine | Admitting: Emergency Medicine

## 2013-09-03 ENCOUNTER — Encounter (HOSPITAL_COMMUNITY): Payer: Self-pay | Admitting: Emergency Medicine

## 2013-09-03 DIAGNOSIS — B3731 Acute candidiasis of vulva and vagina: Secondary | ICD-10-CM | POA: Diagnosis not present

## 2013-09-03 DIAGNOSIS — Z3202 Encounter for pregnancy test, result negative: Secondary | ICD-10-CM | POA: Diagnosis not present

## 2013-09-03 DIAGNOSIS — J45909 Unspecified asthma, uncomplicated: Secondary | ICD-10-CM | POA: Diagnosis not present

## 2013-09-03 DIAGNOSIS — N390 Urinary tract infection, site not specified: Secondary | ICD-10-CM | POA: Diagnosis not present

## 2013-09-03 DIAGNOSIS — Z87891 Personal history of nicotine dependence: Secondary | ICD-10-CM | POA: Insufficient documentation

## 2013-09-03 DIAGNOSIS — M549 Dorsalgia, unspecified: Secondary | ICD-10-CM | POA: Insufficient documentation

## 2013-09-03 DIAGNOSIS — Z792 Long term (current) use of antibiotics: Secondary | ICD-10-CM | POA: Insufficient documentation

## 2013-09-03 DIAGNOSIS — B9689 Other specified bacterial agents as the cause of diseases classified elsewhere: Secondary | ICD-10-CM | POA: Insufficient documentation

## 2013-09-03 DIAGNOSIS — Z8659 Personal history of other mental and behavioral disorders: Secondary | ICD-10-CM | POA: Diagnosis not present

## 2013-09-03 DIAGNOSIS — N760 Acute vaginitis: Secondary | ICD-10-CM | POA: Diagnosis not present

## 2013-09-03 DIAGNOSIS — Z8719 Personal history of other diseases of the digestive system: Secondary | ICD-10-CM | POA: Insufficient documentation

## 2013-09-03 DIAGNOSIS — B373 Candidiasis of vulva and vagina: Secondary | ICD-10-CM | POA: Diagnosis not present

## 2013-09-03 DIAGNOSIS — A499 Bacterial infection, unspecified: Secondary | ICD-10-CM | POA: Diagnosis not present

## 2013-09-03 DIAGNOSIS — Z79899 Other long term (current) drug therapy: Secondary | ICD-10-CM | POA: Insufficient documentation

## 2013-09-03 DIAGNOSIS — R109 Unspecified abdominal pain: Secondary | ICD-10-CM | POA: Diagnosis present

## 2013-09-03 LAB — URINALYSIS, ROUTINE W REFLEX MICROSCOPIC
BILIRUBIN URINE: NEGATIVE
GLUCOSE, UA: NEGATIVE mg/dL
Ketones, ur: NEGATIVE mg/dL
Nitrite: POSITIVE — AB
PROTEIN: NEGATIVE mg/dL
Specific Gravity, Urine: 1.023 (ref 1.005–1.030)
Urobilinogen, UA: 1 mg/dL (ref 0.0–1.0)
pH: 7 (ref 5.0–8.0)

## 2013-09-03 LAB — URINE MICROSCOPIC-ADD ON

## 2013-09-03 LAB — WET PREP, GENITAL: TRICH WET PREP: NONE SEEN

## 2013-09-03 LAB — POC URINE PREG, ED: Preg Test, Ur: NEGATIVE

## 2013-09-03 MED ORDER — METRONIDAZOLE 500 MG PO TABS
500.0000 mg | ORAL_TABLET | Freq: Once | ORAL | Status: AC
  Administered 2013-09-03: 500 mg via ORAL
  Filled 2013-09-03: qty 1

## 2013-09-03 MED ORDER — FLUCONAZOLE 200 MG PO TABS: 200.0000 mg | ORAL_TABLET | Freq: Every day | ORAL | Status: AC

## 2013-09-03 MED ORDER — FLUCONAZOLE 150 MG PO TABS
150.0000 mg | ORAL_TABLET | Freq: Once | ORAL | Status: AC
  Administered 2013-09-03: 150 mg via ORAL
  Filled 2013-09-03: qty 1

## 2013-09-03 MED ORDER — METRONIDAZOLE 500 MG PO TABS: 500.0000 mg | ORAL_TABLET | Freq: Two times a day (BID) | ORAL | Status: AC

## 2013-09-03 MED ORDER — CEPHALEXIN 500 MG PO CAPS: 500.0000 mg | ORAL_CAPSULE | Freq: Three times a day (TID) | ORAL | Status: AC

## 2013-09-03 MED ORDER — CEPHALEXIN 500 MG PO CAPS
500.0000 mg | ORAL_CAPSULE | Freq: Once | ORAL | Status: AC
  Administered 2013-09-03: 500 mg via ORAL
  Filled 2013-09-03: qty 1

## 2013-09-03 NOTE — Discharge Instructions (Signed)
Bacterial Vaginosis Bacterial vaginosis is a vaginal infection that occurs when the normal balance of bacteria in the vagina is disrupted. It results from an overgrowth of certain bacteria. This is the most common vaginal infection in women of childbearing age. Treatment is important to prevent complications, especially in pregnant women, as it can cause a premature delivery. CAUSES  Bacterial vaginosis is caused by an increase in harmful bacteria that are normally present in smaller amounts in the vagina. Several different kinds of bacteria can cause bacterial vaginosis. However, the reason that the condition develops is not fully understood. RISK FACTORS Certain activities or behaviors can put you at an increased risk of developing bacterial vaginosis, including:  Having a new sex partner or multiple sex partners.  Douching.  Using an intrauterine device (IUD) for contraception. Women do not get bacterial vaginosis from toilet seats, bedding, swimming pools, or contact with objects around them. SIGNS AND SYMPTOMS  Some women with bacterial vaginosis have no signs or symptoms. Common symptoms include:  Grey vaginal discharge.  A fishlike odor with discharge, especially after sexual intercourse.  Itching or burning of the vagina and vulva.  Burning or pain with urination. DIAGNOSIS  Your health care provider will take a medical history and examine the vagina for signs of bacterial vaginosis. A sample of vaginal fluid may be taken. Your health care provider will look at this sample under a microscope to check for bacteria and abnormal cells. A vaginal pH test may also be done.  TREATMENT  Bacterial vaginosis may be treated with antibiotic medicines. These may be given in the form of a pill or a vaginal cream. A second round of antibiotics may be prescribed if the condition comes back after treatment.  HOME CARE INSTRUCTIONS   Only take over-the-counter or prescription medicines as  directed by your health care provider.  If antibiotic medicine was prescribed, take it as directed. Make sure you finish it even if you start to feel better.  Do not have sex until treatment is completed.  Tell all sexual partners that you have a vaginal infection. They should see their health care provider and be treated if they have problems, such as a mild rash or itching.  Practice safe sex by using condoms and only having one sex partner. SEEK MEDICAL CARE IF:   Your symptoms are not improving after 3 days of treatment.  You have increased discharge or pain.  You have a fever. MAKE SURE YOU:   Understand these instructions.  Will watch your condition.  Will get help right away if you are not doing well or get worse. FOR MORE INFORMATION  Centers for Disease Control and Prevention, Division of STD Prevention: [employer]raud[employer] American Sexual Health Association (ASHA): www.ashastd.org  Document Released: 02/03/2005 Document Revised: 11/24/2012 Document Reviewed: 09/15/2012 [facility] Patient Information 2015 [place], [place]. This information is not intended to replace advice given to you by your health care provider. Make sure you discuss any questions you have with your health care provider.  Candidal Vulvovaginitis Candidal vulvovaginitis is an infection of the vagina and vulva. The vulva is the skin around the opening of the vagina. This may cause itching and discomfort in and around the vagina.  HOME CARE  Only take medicine as told by your doctor.  Do not have sex (intercourse) until the infection is healed or as told by your doctor.  Practice safe sex.  Tell your sex partner about your infection.  Do not douche or use tampons.  Wear  cotton underwear. Do not wear tight pants or panty hose. °· Eat yogurt. This may help treat and prevent yeast infections. °GET HELP RIGHT AWAY IF:  °· You have a fever. °· Your problems get worse during treatment or do not get better in 3  days. °· You have discomfort, irritation, or itching in your vagina or vulva area. °· You have pain after sex. °· You start to get belly (abdominal) pain. °MAKE SURE YOU: °· Understand these instructions. °· Will watch your condition. °· Will get help right away if you are not doing well or get worse. °Document Released: 05/02/2008 Document Revised: 02/08/2013 Document Reviewed: 05/02/2008 °ExitCare® Patient Information ©2015 [employer]. This information is not intended to replace advice given to you by your health care provider. Make sure you discuss any questions you have with your health care provider. ° °Urinary Tract Infection °A urinary tract infection (UTI) can occur any place along the urinary tract. The tract includes the kidneys, ureters, bladder, and urethra. A type of germ called bacteria often causes a UTI. UTIs are often helped with antibiotic medicine.  °HOME CARE  °· If given, take antibiotics as told by your doctor. Finish them even if you start to feel better. °· Drink enough fluids to keep your pee (urine) clear or pale yellow. °· Avoid tea, drinks with caffeine, and bubbly (carbonated) drinks. °· Pee often. Avoid holding your pee in for a long time. °· Pee before and after having sex (intercourse). °· Wipe from front to back after you poop (bowel movement) if you are a woman. Use each tissue only once. °GET HELP RIGHT AWAY IF:  °· You have back pain. °· You have lower belly (abdominal) pain. °· You have chills. °· You feel sick to your stomach (nauseous). °· You throw up (vomit). °· Your burning or discomfort with peeing does not go away. °· You have a fever. °· Your symptoms are not better in 3 days. °MAKE SURE YOU:  °· Understand these instructions. °· Will watch your condition. °· Will get help right away if you are not doing well or get worse. °Document Released: 07/23/2007 Document Revised: 10/29/2011 Document Reviewed: 09/04/2011 °ExitCare® Patient Information ©2015 [employer]. This  information is not intended to replace advice given to you by your health care provider. Make sure you discuss any questions you have with your health care provider. ° °

## 2013-09-03 NOTE — ED Notes (Signed)
Patient states she has had low back pain x3 days, today she began having frequent urination. Patient also reports a "weird" vaginal discharge she was having, that has since resolved, that she would like to have evaluated. Patient also c/o epigastric pain.

## 2013-09-03 NOTE — ED Provider Notes (Signed)
CSN: [id number]     Arrival date & time 09/03/13  1917 History   First MD Initiated Contact with Patient 09/03/13 1930     Chief Complaint  Patient presents with  . Abdominal Pain    epigastric  . Back Pain    low back x3 days  . Vaginal Discharge    has resolved, thinks it might be yeast infection  . Urinary Frequency    started today     HPI Patient presents emergency Department with mild low back pain and new vaginal discharge as well as urinary frequency over the past 3 days.  Denies nausea vomiting.  No fevers or chills.  She reports vaginal discharge seems to be somewhat improving over the past 24-48 hours.  She had transient sharp epigastric discomfort this evening as well which is now resolved.  This occurred shortly after eating.  Symptoms are mild in severity.  No other complaints   Past Medical History  Diagnosis Date  . Asthma   . Anxiety   . GERD (gastroesophageal reflux disease)   . Trichomonas    Past Surgical History  Procedure Laterality Date  . Vaginal delivery      X 1, no complications   Family History  Problem Relation Age of Onset  . Other Neg Hx   . Diabetes Mother   . Heart disease Mother   . Hypertension Mother   . Kidney disease Mother   . Arthritis Maternal Grandmother   . Asthma Maternal Grandmother   . Diabetes Maternal Grandmother   . Heart disease Maternal Grandmother   . Hypertension Maternal Grandmother   . Stroke Maternal Grandmother    History  Substance Use Topics  . Smoking status: Former Smoker -- 0.30 packs/day    Types: Cigarettes    Quit date: 09/14/2011  . Smokeless tobacco: Never Used  . Alcohol Use: No   OB History   Grav Para Term Preterm Abortions TAB SAB Ect Mult Living   2 2 2       2      Review of Systems  All other systems reviewed and are negative.     Allergies  Review of patient's allergies indicates no known allergies.  Home Medications   Prior to Admission medications   Medication Sig Start  Date End Date Taking? Authorizing Provider  acetaminophen (TYLENOL) 325 MG tablet Take 650 mg by mouth every 6 (six) hours as needed (pain).   Yes Historical Provider, MD  albuterol (PROVENTIL HFA;VENTOLIN HFA) 108 (90 BASE) MCG/ACT inhaler Inhale 1 puff into the lungs every 6 (six) hours as needed for wheezing or shortness of breath.   Yes Historical Provider, MD  ibuprofen (ADVIL,MOTRIN) 200 MG tablet Take 400 mg by mouth every 6 (six) hours as needed (pain).   Yes Historical Provider, MD  medroxyPROGESTERone (DEPO-PROVERA) 150 MG/ML injection Inject 150 mg into the muscle every 3 (three) months.   Yes Historical Provider, MD  cephALEXin (KEFLEX) 500 MG capsule Take 1 capsule (500 mg total) by mouth 3 (three) times daily. 09/03/13   Lyanne CoKevin M Wojciech Willetts, MD  fluconazole (DIFLUCAN) 200 MG tablet Take 1 tablet (200 mg total) by mouth daily. 09/03/13 09/10/13  Lyanne CoKevin M Falynn Ailey, MD  metroNIDAZOLE (FLAGYL) 500 MG tablet Take 1 tablet (500 mg total) by mouth 2 (two) times daily. 09/03/13   Lyanne CoKevin M Ezeriah Luty, MD   BP 119/84  Pulse 77  Temp(Src) 99 F (37.2 C) (Oral)  Resp 20  Ht 5\' 7"  (1.702 m)  Wt 145 lb (65.772 kg)  BMI 22.71 kg/m2  SpO2 98%  LMP 08/09/2013 Physical Exam  Nursing note and vitals reviewed. Constitutional: She is oriented to person, place, and time. She appears well-developed and well-nourished. No distress.  HENT:  Head: Normocephalic and atraumatic.  Eyes: EOM are normal.  Neck: Normal range of motion.  Cardiovascular: Normal rate, regular rhythm and normal heart sounds.   Pulmonary/Chest: Effort normal and breath sounds normal.  Abdominal: Soft. She exhibits no distension. There is no tenderness.  Genitourinary:  Normal external genitalia.  White cervical discharge noted.  No bleeding.  No cervical motion tenderness.  No adnexal masses or fullness.  Musculoskeletal: Normal range of motion.  Neurological: She is alert and oriented to person, place, and time.  Skin: Skin is warm and  dry.  Psychiatric: She has a normal mood and affect. Judgment normal.    ED Course  Procedures (including critical care time) Labs Review Labs Reviewed  WET PREP, GENITAL - Abnormal; Notable for the following:    Yeast Wet Prep HPF POC MANY (*)    Clue Cells Wet Prep HPF POC MANY (*)    WBC, Wet Prep HPF POC MANY (*)    All other components within normal limits  URINALYSIS, ROUTINE W REFLEX MICROSCOPIC - Abnormal; Notable for the following:    Color, Urine ORANGE (*)    APPearance CLOUDY (*)    Hgb urine dipstick SMALL (*)    Nitrite POSITIVE (*)    Leukocytes, UA LARGE (*)    All other components within normal limits  URINE MICROSCOPIC-ADD ON - Abnormal; Notable for the following:    Bacteria, UA MANY (*)    All other components within normal limits  GC/CHLAMYDIA PROBE AMP  POC URINE PREG, ED    Imaging Review No results found.   EKG Interpretation   Date/Time:  Saturday September 03 2013 19:37:26 EDT Ventricular Rate:  85 PR Interval:  112 QRS Duration: 98 QT Interval:  378 QTC Calculation: 449 R Axis:   47 Text Interpretation:  Sinus rhythm Borderline short PR interval No old  tracing to compare Confirmed by Tobi Groesbeck  MD, Caryn Bee ([id number]) on 09/03/2013  7:41:15 PM      MDM   Final diagnoses:  Urinary tract infection without hematuria, site unspecified  BV (bacterial vaginosis)  Vaginal yeast infection    Patient with UTI, BV, yeast infection.  Treated with Keflex, Diflucan, Flagyl.  Patient states she sometimes does a warm water douche which I recommended she avoid in the future    Lyanne Co, MD 09/03/13 2248

## 2013-09-05 LAB — GC/CHLAMYDIA PROBE AMP
CT PROBE, AMP APTIMA: NEGATIVE
GC PROBE AMP APTIMA: NEGATIVE

## 2013-10-13 ENCOUNTER — Ambulatory Visit: Payer: Medicaid Other | Admitting: Obstetrics & Gynecology

## 2013-10-17 ENCOUNTER — Encounter (HOSPITAL_COMMUNITY): Payer: Self-pay | Admitting: Emergency Medicine

## 2013-10-17 ENCOUNTER — Emergency Department (HOSPITAL_COMMUNITY)
Admission: EM | Admit: 2013-10-17 | Discharge: 2013-10-17 | Disposition: A | Discharge status: Home / Self Care | Payer: Medicaid Other | Source: Home / Self Care | Attending: Emergency Medicine | Admitting: Emergency Medicine

## 2013-10-17 DIAGNOSIS — N3000 Acute cystitis without hematuria: Secondary | ICD-10-CM

## 2013-10-17 LAB — POCT URINALYSIS DIP (DEVICE)
GLUCOSE, UA: NEGATIVE mg/dL
Nitrite: NEGATIVE
Protein, ur: 30 mg/dL — AB
SPECIFIC GRAVITY, URINE: 1.025 (ref 1.005–1.030)
Urobilinogen, UA: 1 mg/dL (ref 0.0–1.0)
pH: 5.5 (ref 5.0–8.0)

## 2013-10-17 LAB — POCT PREGNANCY, URINE: Preg Test, Ur: NEGATIVE

## 2013-10-17 MED ORDER — FLUCONAZOLE 150 MG PO TABS: 150.0000 mg | ORAL_TABLET | Freq: Once | ORAL | Status: AC

## 2013-10-17 MED ORDER — CIPROFLOXACIN HCL 500 MG PO TABS: 500.0000 mg | ORAL_TABLET | Freq: Two times a day (BID) | ORAL | Status: AC

## 2013-10-17 NOTE — ED Notes (Signed)
Pt     Reports       Symptoms      Of    Urinary  Frequency  And  Discomfort  With  burning  And  Frequency  Present         Pt  Reports  The  Symptoms  Not  releived  By     otc  Measures         Such  As  Cranberry  Juice   Etc

## 2013-10-17 NOTE — ED Provider Notes (Signed)
  Chief Complaint   Chief Complaint  Patient presents with  . Urinary Frequency    History of Present Illness   Marissa Lucas a 23 year old female who's had a two-day history of dysuria, frequency, and urgency. She denies any fever, chills, abdominal pain, nausea, or vomiting. She has had some lower back pain. No GYN symptoms. She's not pregnant or breast-feeding and she's had several urinary tract infections in the past 3 or 4 months. She had one culture done which showed coag negative staph and she's been treated with Keflex x2.  Review of Systems   Other than as noted above, the patient denies any of the following symptoms: General:  No fevers or chills. GI:  No abdominal pain, back pain, nausea, or vomiting. GU:  No hematuria or incontinence. GYN:  No discharge, itching, vulvar pain or lesions, pelvic pain, or abnormal vaginal bleeding.  PMFSH   Past medical history, family history, social history, meds, and allergies were reviewed.  She has asthma and takes albuterol for that and is on Depo-Provera for birth control.  Physical Examination     Vital signs:  BP 90/70  Pulse 78  Temp(Src) 98.6 F (37 C) (Oral)  Resp 18  SpO2 100% Gen:  Alert, oriented, in no distress. Lungs:  Clear to auscultation, no wheezes, rales or rhonchi. Heart:  Regular rhythm, no gallop or murmer. Abdomen:  Flat and soft. There was slight suprapubic pain to palpation.  No guarding, or rebound.  No hepato-splenomegaly or mass.  Bowel sounds were normally active.  No hernia. Back:  No CVA tenderness.  Skin:  Clear, warm and dry.  Labs   Results for orders placed during the hospital encounter of 10/17/13  POCT PREGNANCY, URINE      Result Value Ref Range   Preg Test, Ur NEGATIVE  NEGATIVE    Her urinalysis shows trace blood, trace leukocytes, and negative nitrites. Specific gravity was 1.025, glucose was negative, she had small bilirubin and trace ketone. PH was 5.5, protein was 30 mg per DL,  and urobilinogen was 1.0.  A urine culture was obtained.  Results are pending at this time and we will call about any positive results.  Assessment   The encounter diagnosis was Acute cystitis without hematuria.   No evidence of pyelonephritis.    Plan   1.  Meds:  The following meds were prescribed:   Discharge Medication List as of 10/17/2013 12:11 PM    START taking these medications   Details  ciprofloxacin (CIPRO) 500 MG tablet Take 1 tablet (500 mg total) by mouth every 12 (twelve) hours., Starting 10/17/2013, Until Discontinued, Normal        2.  Patient Education/Counseling:  The patient was given appropriate handouts, self care instructions, and instructed in symptomatic relief. The patient was told to avoid intercourse for 10 days, get extra fluids, and return for a follow up with her primary care doctor at the completion of treatment for a repeat UA and culture.    3.  Follow up:  The patient was told to follow up here if no better in 3 to 4 days, or sooner if becoming worse in any way, and given some red flag symptoms such as fever, persistent vomiting, or severe flank or abdominal pain which would prompt immediate return.     Reuben Likes, MD 10/17/13 [phone]

## 2013-10-17 NOTE — Discharge Instructions (Signed)
To restore the normal balance of "good bacteria" in your system.  Take a probiotic once daily.  These can be gotten over the counter at the drug store without a prescription and come under various brand names such as Culturelle, Align, Florastore, and Phillips.  The best thing to do is to ask your pharmacist to recommend a good probiotic that is not too expensive.  ° ° °Urinary Tract Infection °Urinary tract infections (UTIs) can develop anywhere along your urinary tract. Your urinary tract is your body's drainage system for removing wastes and extra water. Your urinary tract includes two kidneys, two ureters, a bladder, and a urethra. Your kidneys are a pair of bean-shaped organs. Each kidney is about the size of your fist. They are located below your ribs, one on each side of your spine. °CAUSES °Infections are caused by microbes, which are microscopic organisms, including fungi, viruses, and bacteria. These organisms are so small that they can only be seen through a microscope. Bacteria are the microbes that most commonly cause UTIs. °SYMPTOMS  °Symptoms of UTIs may vary by age and gender of the patient and by the location of the infection. Symptoms in young women typically include a frequent and intense urge to urinate and a painful, burning feeling in the bladder or urethra during urination. Older women and men are more likely to be tired, shaky, and weak and have muscle aches and abdominal pain. A fever may mean the infection is in your kidneys. Other symptoms of a kidney infection include pain in your back or sides below the ribs, nausea, and vomiting. °DIAGNOSIS °To diagnose a UTI, your caregiver will ask you about your symptoms. Your caregiver also will ask to provide a urine sample. The urine sample will be tested for bacteria and white blood cells. White blood cells are made by your body to help fight infection. °TREATMENT  °Typically, UTIs can be treated with medication. Because most UTIs are caused by a  bacterial infection, they usually can be treated with the use of antibiotics. The choice of antibiotic and length of treatment depend on your symptoms and the type of bacteria causing your infection. °HOME CARE INSTRUCTIONS °· If you were prescribed antibiotics, take them exactly as your caregiver instructs you. Finish the medication even if you feel better after you have only taken some of the medication. °· Drink enough water and fluids to keep your urine clear or pale yellow. °· Avoid caffeine, tea, and carbonated beverages. They tend to irritate your bladder. °· Empty your bladder often. Avoid holding urine for long periods of time. °· Empty your bladder before and after sexual intercourse. °· After a bowel movement, women should cleanse from front to back. Use each tissue only once. °SEEK MEDICAL CARE IF:  °· You have back pain. °· You develop a fever. °· Your symptoms do not begin to resolve within 3 days. °SEEK IMMEDIATE MEDICAL CARE IF:  °· You have severe back pain or lower abdominal pain. °· You develop chills. °· You have nausea or vomiting. °· You have continued burning or discomfort with urination. °MAKE SURE YOU:  °· Understand these instructions. °· Will watch your condition. °· Will get help right away if you are not doing well or get worse. °Document Released: 11/13/2004 Document Revised: 08/05/2011 Document Reviewed: 03/14/2011 °[employer]® Patient Information ©2015 [employer]. This information is not intended to replace advice given to you by your health care provider. Make sure you discuss any questions you have with your health   care provider. ° °

## 2013-10-18 LAB — URINE CULTURE
CULTURE: NO GROWTH
Colony Count: NO GROWTH
SPECIAL REQUESTS: NORMAL

## 2013-11-09 ENCOUNTER — Ambulatory Visit: Payer: Medicaid Other

## 2013-12-19 ENCOUNTER — Encounter (HOSPITAL_COMMUNITY): Payer: Self-pay | Admitting: Emergency Medicine

## 2014-05-10 IMAGING — US US OB COMP +14 WK
1 of 2 series · 12 of 28 positions shown · non-contrast
Comparison: none

[Series 1: us ob comp +14 wk · 12 of 90 slices shown]
[im 4/90]
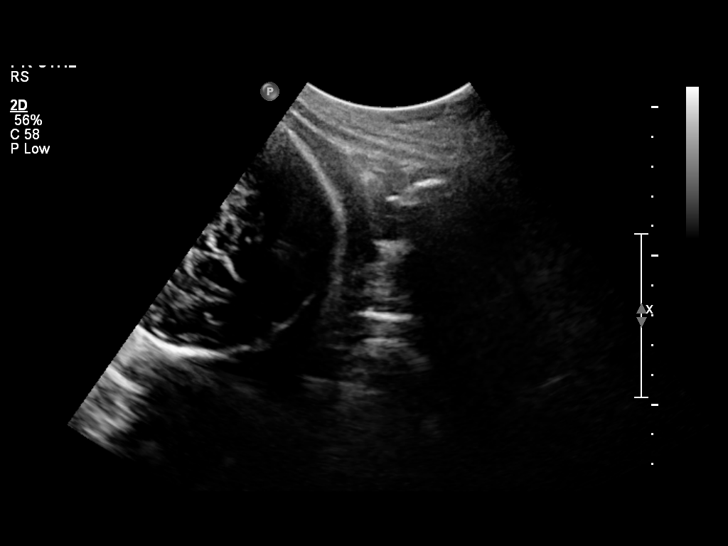
[im 11/90]
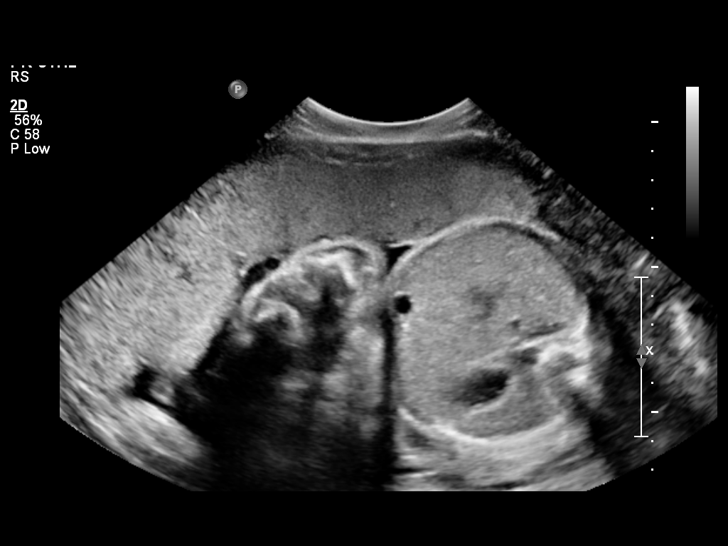
[im 18/90]
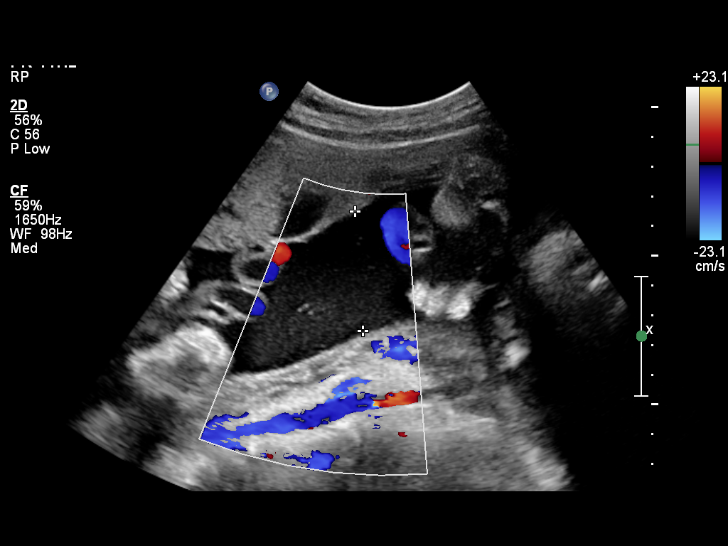
[im 28/90]
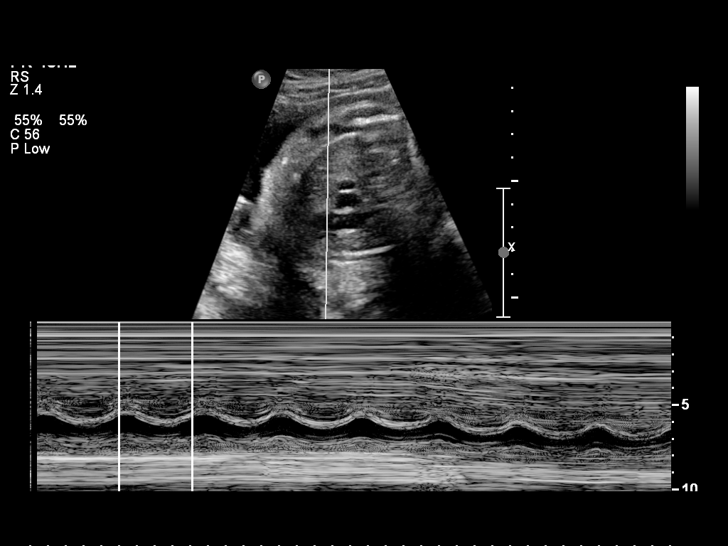
[im 35/90]
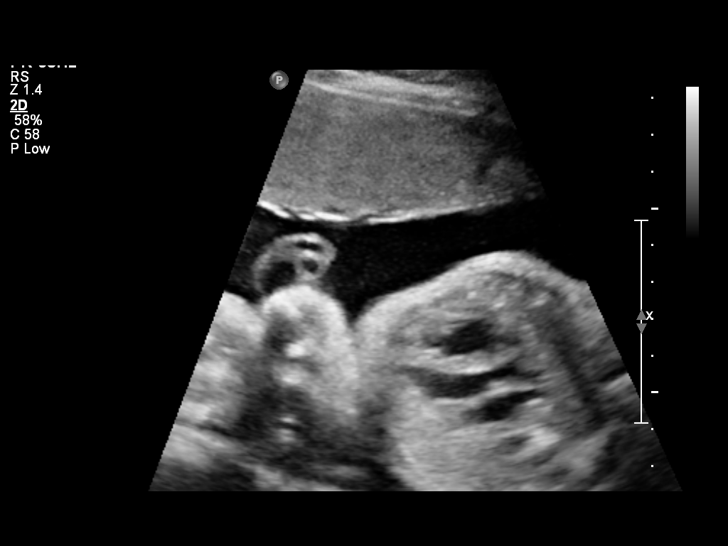
[im 42/90]
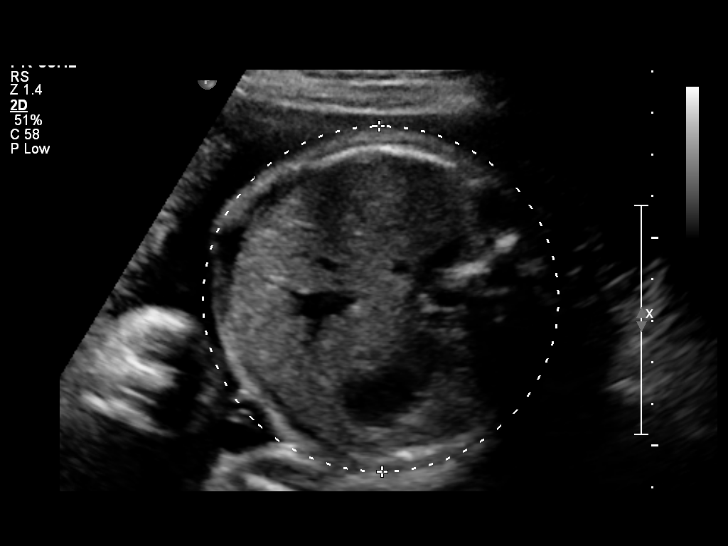
[im 52/90]
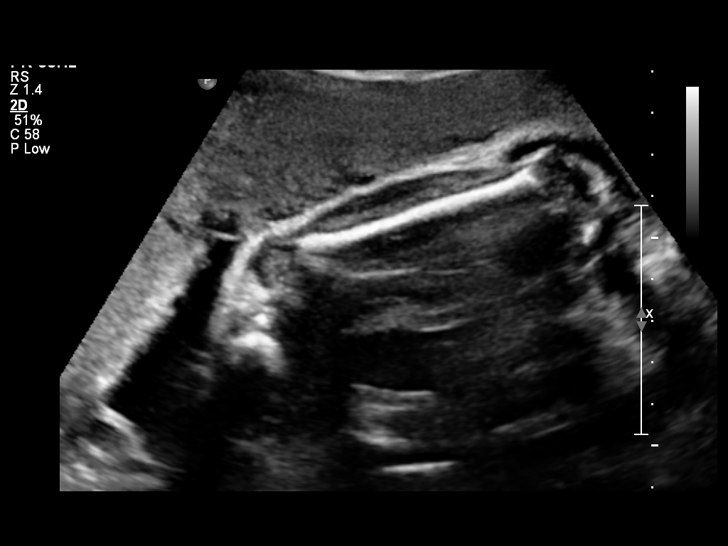
[im 59/90]
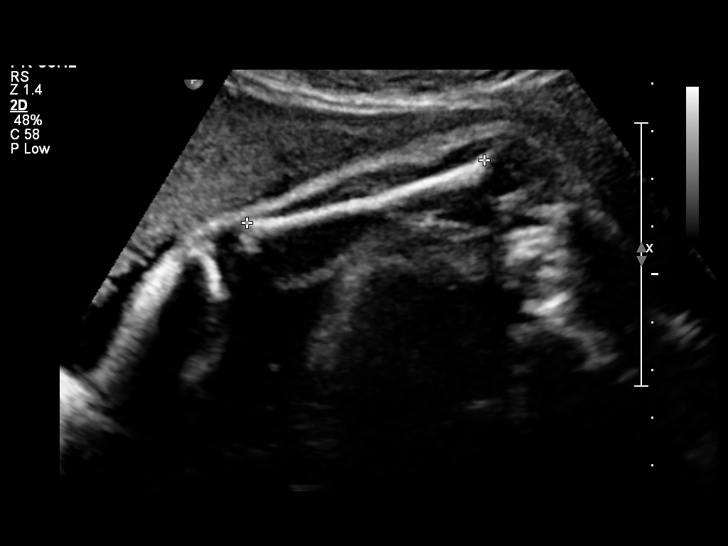
[im 66/90]
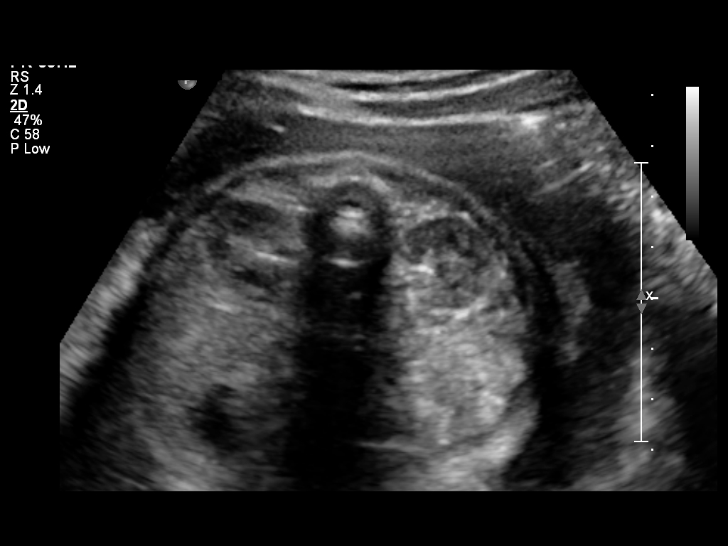
[im 76/90]
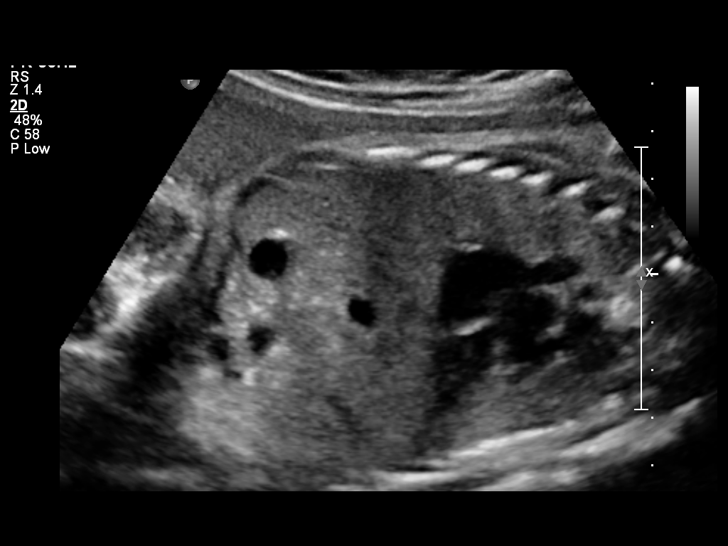
[im 83/90]
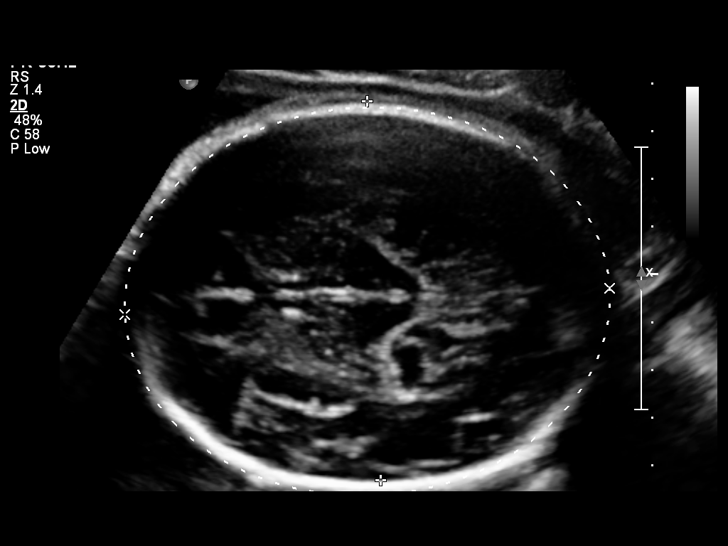
[im 90/90]
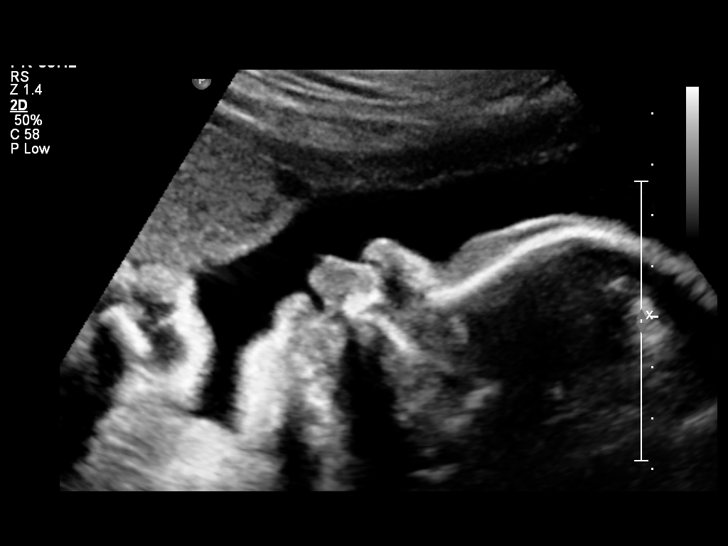

[12 of 28 positions shown; findings below may reference images not displayed]

OBSTETRICS REPORT
                      (Signed Final 02/19/2012 [DATE])

Service(s) Provided

 US OB COMP + 14 WK                                    76805.1
Indications

 Basic anatomic survey
 No or Little Prenatal Care
Fetal Evaluation

 Num Of Fetuses:    1
 Fetal Heart Rate:  146                         bpm
 Cardiac Activity:  Observed
 Presentation:      Cephalic
 Placenta:          Anterior, above cervical os
 P. Cord            Visualized
 Insertion:

 Amniotic Fluid
 AFI FV:      Subjectively within normal limits
 AFI Sum:     14.65   cm      51   %Tile     Larg Pckt:   4.01   cm
 RUQ:   3.38   cm    RLQ:    4.01   cm    LUQ:   3.78    cm   LLQ:    3.48   cm
Biometry

 BPD:     78.6  mm    G. Age:   31w 4d                CI:        74.84   70 - 86
                                                      FL/HC:      20.3   19.3 -

 HC:     288.3  mm    G. Age:   31w 5d       33  %    HC/AC:      1.11   0.96 -

 AC:     259.2  mm    G. Age:   30w 1d       21  %    FL/BPD:     74.4   71 - 87
 FL:      58.5  mm    G. Age:   30w 4d       24  %    FL/AC:      22.6   20 - 24
 HUM:     51.2  mm    G. Age:   30w 0d       30  %

 Est. FW:    4464  gm      3 lb 8 oz     45  %
Gestational Age

 LMP:           31w 0d       Date:   07/17/11                 EDD:   04/22/12
 U/S Today:     31w 0d                                        EDD:   04/22/12
 Best:          31w 0d    Det. By:   LMP  (07/17/11)          EDD:   04/22/12
Anatomy

 Cranium:          Appears normal         Aortic Arch:      Basic anatomy
                                                            exam per order
 Fetal Cavum:      Appears normal         Ductal Arch:      Basic anatomy
                                                            exam per order
 Ventricles:       Appears normal         Diaphragm:        Appears normal
 Choroid Plexus:   Appears normal         Stomach:          Appears normal, left
                                                            sided
 Cerebellum:       Not well visualized    Abdomen:          Appears normal
 Posterior Fossa:  Not well visualized    Abdominal Wall:   Not well visualized
 Nuchal Fold:      Not applicable (>20    Cord Vessels:     Appears normal (3
                   wks GA)                                  vessel cord)
 Face:             Basic Anatomy          Kidneys:          Appear normal
                   exam
 Lips:             Appears normal         Bladder:          Appears normal
 Heart:            Appears normal         Spine:            Appears normal
                   (4CH, axis, and
                   situs)
 RVOT:             Appears normal         Lower             Appears normal
                                          Extremities:
 LVOT:             Appears normal         Upper             Appears normal
                                          Extremities:

 Other:  Technically difficult due to advanced gestational age.
Targeted Anatomy

 Fetal Central Nervous System
 Lat. Ventricles:
Cervix Uterus Adnexa

 Cervical Length:   3.33      cm

 Cervix:       Normal appearance by transabdominal scan. Closed
 Left Ovary:   Not visualized.
 Right Ovary:  Not visualized.
 Adnexa:     No abnormality visualized.
Impression

 Single living IUP.  US EGA is concordant with LMP.
 Suboptimal visualization of anatomy due to advanced GA,
 however no fetal anomalies seen involving visualized
 anatomy.
 Amniotic fluid within normal limits, with AFI of 14.65 cm.
 Normal cervical length.

 questions or concerns.
